# Patient Record
Sex: Female | Born: 1935 | Race: White | Hispanic: No | Smoking: Never smoker
Health system: Southern US, Community
[De-identification: ages and names within clinical notes are randomized; demographics above are authoritative.]

## PROBLEM LIST (undated history)

## (undated) DIAGNOSIS — E785 Hyperlipidemia, unspecified: Secondary | ICD-10-CM

## (undated) DIAGNOSIS — I10 Essential (primary) hypertension: Secondary | ICD-10-CM

## (undated) DIAGNOSIS — F0390 Unspecified dementia without behavioral disturbance: Secondary | ICD-10-CM

## (undated) DIAGNOSIS — J309 Allergic rhinitis, unspecified: Secondary | ICD-10-CM

## (undated) HISTORY — DX: Allergic rhinitis, unspecified: J30.9

## (undated) HISTORY — DX: Unspecified dementia, unspecified severity, without behavioral disturbance, psychotic disturbance, mood disturbance, and anxiety: F03.90

## (undated) HISTORY — PX: APPENDECTOMY: SHX54

## (undated) HISTORY — PX: ABDOMINAL HYSTERECTOMY: SHX81

## (undated) HISTORY — DX: Essential (primary) hypertension: I10

## (undated) HISTORY — DX: Hyperlipidemia, unspecified: E78.5

---

## 2009-02-27 ENCOUNTER — Ambulatory Visit: Payer: Self-pay | Admitting: Internal Medicine

## 2009-02-27 ENCOUNTER — Telehealth: Payer: Self-pay | Admitting: Internal Medicine

## 2009-02-27 DIAGNOSIS — R51 Headache: Secondary | ICD-10-CM

## 2009-02-27 DIAGNOSIS — R519 Headache, unspecified: Secondary | ICD-10-CM | POA: Insufficient documentation

## 2009-02-27 LAB — CONVERTED CEMR LAB
AST: 20 units/L (ref 0–37)
Albumin: 3.8 g/dL (ref 3.5–5.2)
Alkaline Phosphatase: 54 units/L (ref 39–117)
BUN: 15 mg/dL (ref 6–23)
Basophils Absolute: 0 10*3/uL (ref 0.0–0.1)
Basophils Relative: 0.3 % (ref 0.0–3.0)
Creatinine, Ser: 0.7 mg/dL (ref 0.4–1.2)
Eosinophils Absolute: 0.1 10*3/uL (ref 0.0–0.7)
Folate: >20 ng/mL
GFR calc non Af Amer: 85.76 mL/min (ref >60)
Lymphs Abs: 1.6 10*3/uL (ref 0.7–4.0)
Monocytes Relative: 7.5 % (ref 3.0–12.0)
Neutro Abs: 5.3 10*3/uL (ref 1.4–7.7)
Neutrophils Relative %: 70.5 % (ref 43.0–77.0)
Platelets: 236 10*3/uL (ref 150.0–400.0)
TSH: 1.77 microintl units/mL (ref 0.35–5.50)
Total Protein: 6.9 g/dL (ref 6.0–8.3)
WBC: 7.6 10*3/uL (ref 4.5–10.5)

## 2009-03-04 ENCOUNTER — Ambulatory Visit: Payer: Self-pay | Admitting: Cardiovascular Disease

## 2009-03-05 ENCOUNTER — Telehealth: Payer: Self-pay | Admitting: Internal Medicine

## 2009-03-05 ENCOUNTER — Encounter (INDEPENDENT_AMBULATORY_CARE_PROVIDER_SITE_OTHER): Payer: Self-pay | Admitting: *Deleted

## 2009-03-28 ENCOUNTER — Ambulatory Visit: Payer: Self-pay | Admitting: Internal Medicine

## 2009-03-28 DIAGNOSIS — F0390 Unspecified dementia without behavioral disturbance: Secondary | ICD-10-CM | POA: Insufficient documentation

## 2009-03-28 DIAGNOSIS — I10 Essential (primary) hypertension: Secondary | ICD-10-CM | POA: Insufficient documentation

## 2009-03-28 DIAGNOSIS — H9319 Tinnitus, unspecified ear: Secondary | ICD-10-CM | POA: Insufficient documentation

## 2009-07-02 ENCOUNTER — Ambulatory Visit: Payer: Self-pay | Admitting: Internal Medicine

## 2009-12-31 ENCOUNTER — Ambulatory Visit: Payer: Self-pay | Admitting: Internal Medicine

## 2009-12-31 DIAGNOSIS — R05 Cough: Secondary | ICD-10-CM | POA: Insufficient documentation

## 2010-02-25 ENCOUNTER — Ambulatory Visit: Payer: Self-pay | Admitting: Internal Medicine

## 2010-02-25 DIAGNOSIS — J309 Allergic rhinitis, unspecified: Secondary | ICD-10-CM | POA: Insufficient documentation

## 2010-04-22 NOTE — Assessment & Plan Note (Signed)
Summary: 4-6 WK FU  STC   Vital Signs:  Patient profile:   77 year old female Height:      64 inches (162.56 cm) Weight:      137.4 pounds (62.45 kg) O2 Sat:      98 % on Room air Temp:     97.5 degrees F (36.39 degrees C) oral Pulse rate:   65 / minute BP sitting:   146 / 78  (left arm) Cuff size:   regular  Vitals Entered By: Orlan Leavens (March 28, 2009 11:09 AM)  O2 Flow:  Room air CC: 4 week follow-up Is Patient Diabetic? No Pain Assessment Patient in pain? no        Primary Care Provider:  Newt Lukes MD  CC:  4 week follow-up.  History of Present Illness: here for followup memory loss no sig change in symptoms per pt dtr no longer taking any herbal supp or vitamins - ?if any could help  also c/o 2 week hx "vibrating" in left ear - denies hearing loss no hearing pain or HA symptoms occur daily, 2-3x/day and last <79min per time - no "pulsing" sensation to the vibration  also ?high blood pressure - dtr reports finding "old medication list" which had "blood pressure" written next to one med -   Current Medications (verified): 1)  None  Allergies (verified): No Known Drug Allergies  Past History:  Past Medical History: dementia hypertension  Review of Systems  The patient denies anorexia, chest pain, headaches, incontinence, and difficulty walking.    Physical Exam  General:  alert, well-developed, well-nourished, and cooperative to examination.   dtr at side Ears:  normal pinnae bilaterally, without erythema, swelling, or tenderness to palpation. TMs clear, without effusion, or cerumen impaction. Hearing grossly normal bilaterally  Mouth:  teeth and gums in good repair; mucous membranes moist, without lesions or ulcers. oropharynx clear without exudate, erythema.  Lungs:  normal respiratory effort, no intercostal retractions or use of accessory muscles; normal breath sounds bilaterally - no crackles and no wheezes.    Heart:  normal rate,  regular rhythm, no murmur, and no rub. BLE without edema. Neurologic:  alert & oriented X3 and cranial nerves II-XII symetrically intact.  strength normal in all extremities, sensation intact to light touch, and gait normal. speech fluent without dysarthria or aphasia; follows commands with good comprehension. 2/3 recall at 3 minutes Psych:  Oriented X3, memory intact for recent and remote, normally interactive, good eye contact, not anxious appearing, not depressed appearing, and not agitated.      Impression & Recommendations:  Problem # 1:  DEMENTIA, MILD (ICD-294.8)  labs, head ct reviewed - normal start aricept - titrate dose after 4-6 weeks, then consider adding namedna Time spent with patient/dtr 25 minutes, more than 50% of this time was spent counseling patient/dtr on memory loss and problems concerning the need for slow medication titrations, possible side effects   Orders: Prescription Created Electronically [phone])  Problem # 2:  TINNITUS, LEFT (ICD-388.30) exam benign - refer to audiology for further eval/tx Orders: Audiology (Audio)  Problem # 3:  HYPERTENSION (ICD-401.9) dtr notes poss hx same but pt denies (?prev on med for blood pressure per old list dtr found - uncertain what med) - should not be exac by aricept but consider antiHTN med prior to initiating namenda in future BP today: 146/78 Prior BP: 140/74 (02/27/2009)  Labs Reviewed: K+: 4.5 (02/27/2009) Creat: : 0.7 (02/27/2009)     Complete  Medication List: 1)  Aricept 5 Mg Tabs (Donepezil hcl) .Marland Kitchen.. 1 by mouth by mouth at bedtime 2)  Calcium 500 Mg Tabs (Calcium carbonate) .Marland Kitchen.. 1 by mouth two times a day 3)  Vitamin D 1000 Unit Tabs (Cholecalciferol) .Marland Kitchen.. 1 by mouth once daily 4)  Multivitamins Tabs (Multiple vitamin) .Marland Kitchen.. 1 by mouth once daily  Patient Instructions: 1)  it was good to see you today.  2)  start Aricept - your prescriptions have been electronically submitted to your pharmacy. Please take as  directed. Contact our office if you believe you're having problems with the medication(s). If tolerating well after 4 weeks, call and we will increase to next higher dose  3)  watch blood pressure - call if >140/90 4)  Please schedule a follow-up appointment in 3 months, sooner if problems.  5)  Take calcium +Vitamin D daily: goal calcium 1200mg /d and Vit D 1000-1200 unit/d Prescriptions: ARICEPT 5 MG TABS (DONEPEZIL HCL) 1 by mouth by mouth at bedtime  #30 x 3   Entered and Authorized by:   Newt Lukes MD   Signed by:   Newt Lukes MD on 03/28/2009   Method used:   Electronically to        [facility] (retail)       [address]       [place], [place]  [address]       Ph: [phone]       Fax: [phone]   RxID:   [id number]

## 2010-04-22 NOTE — Assessment & Plan Note (Signed)
Summary: 3 MO ROV /NWS   Vital Signs:  Patient profile:   77 year old female Height:      64 inches (162.56 cm) Weight:      145.0 pounds (65.91 kg) O2 Sat:      95 % on Room air Temp:     97.6 degrees F (36.44 degrees C) oral Pulse rate:   71 / minute BP sitting:   120 / 62  (left arm) Cuff size:   regular  Vitals Entered By: Orlan Leavens (July 02, 2009 10:17 AM)  O2 Flow:  Room air CC: 3 month follow-up Is Patient Diabetic? No Pain Assessment Patient in pain? no        Primary Care Provider:  Newt Lukes MD  CC:  3 month follow-up.  History of Present Illness: here for 3 month followup   memory loss no sig change in symptoms per pt dtr - no notable decline or worsening- no longer taking any herbal supp or vitamins -  no adv SE of aricept  also ?high blood pressure - dtr reports finding "old medication list" which had "blood pressure" written next to one med -  home BP 130/60s x 2 in past 3 weeks - no HA, vision change or edema  Current Medications (verified): 1)  Aricept 5 Mg Tabs (Donepezil Hcl) .Marland Kitchen.. 1 By Mouth By Mouth At Bedtime 2)  Calcium 500 Mg Tabs (Calcium Carbonate) .Marland Kitchen.. 1 By Mouth Two Times A Day 3)  Vitamin D 1000 Unit Tabs (Cholecalciferol) .Marland Kitchen.. 1 By Mouth Once Daily 4)  Multivitamins  Tabs (Multiple Vitamin) .Marland Kitchen.. 1 By Mouth Once Daily  Allergies (verified): No Known Drug Allergies  Past History:  Past Medical History: dementia hypertension, borderline  Review of Systems  The patient denies anorexia, fever, weight loss, hoarseness, chest pain, syncope, dyspnea on exertion, and headaches.    Physical Exam  General:  alert, well-developed, well-nourished, and cooperative to examination.   dtr at side Lungs:  normal respiratory effort, no intercostal retractions or use of accessory muscles; normal breath sounds bilaterally - no crackles and no wheezes.    Heart:  normal rate, regular rhythm, no murmur, and no rub. BLE without  edema. Neurologic:  alert & oriented X3 and cranial nerves II-XII symetrically intact.  strength normal in all extremities, sensation intact to light touch, and gait normal. speech fluent without dysarthria or aphasia; follows commands with good comprehension. 2/3 recall at 3 minutes   Impression & Recommendations:  Problem # 1:  DEMENTIA, MILD (ICD-294.8) Assessment Unchanged  labs, head ct reviewed - normal cont aricept - no dose changes needed -in future consider adding nameda as needed   Problem # 2:  HYPERTENSION (ICD-401.9) Assessment: Improved  cont with diet, salt restirction and exercise control w/o meds for now  BP today: 120/62 Prior BP: 146/78 (03/28/2009)  Labs Reviewed: K+: 4.5 (02/27/2009) Creat: : 0.7 (02/27/2009)     Complete Medication List: 1)  Aricept 5 Mg Tabs (Donepezil hcl) .Marland Kitchen.. 1 by mouth by mouth at bedtime 2)  Calcium 500 Mg Tabs (Calcium carbonate) .Marland Kitchen.. 1 by mouth two times a day 3)  Vitamin D 1000 Unit Tabs (Cholecalciferol) .Marland Kitchen.. 1 by mouth once daily 4)  Multivitamins Tabs (Multiple vitamin) .Marland Kitchen.. 1 by mouth once daily  Patient Instructions: 1)  it was good to see you today. 2)  refill aricept without other medication changes today -  3)  Please schedule a follow-up appointment in 4-6 months to monitor, call  sooner if problems.  4)  consider vaccination options available including tetnus/diptheria (whooping cough), shingles and pneumonia  - let us know if you have interest in obtaining any of these recommended shots - Prescriptions: ARICEPT 5 MG TABS (DONEPEZIL HCL) 1 by mouth by mouth at bedtime  #30 x 5   Entered and Authorized by:   Newt Lukes MD   Signed by:   Newt Lukes MD on 07/02/2009   Method used:   Electronically to        [facility] (retail)       [address]       [place], [place]  [address]       Ph: [phone]       Fax: [phone]   RxID:   [id number] ARICEPT 5 MG TABS  (DONEPEZIL HCL) 1 by mouth by mouth at bedtime  #30 x 5   Entered by:   Orlan Leavens   Authorized by:   Newt Lukes MD   Signed by:   Orlan Leavens on 07/02/2009   Method used:   Electronically to        [facility] (retail)       [address]       [place], [place]  [address]       Ph: [phone]       Fax: [phone]   RxID:   [id number]

## 2010-04-22 NOTE — Assessment & Plan Note (Signed)
Summary: PER PT 6 MTH FU  STC   Vital Signs:  Patient profile:   77 year old female Height:      64 inches (162.56 cm) Weight:      148.12 pounds (67.33 kg) BMI:     25.52 O2 Sat:      96 % on Room air Temp:     97.3 degrees F (36.28 degrees C) oral Pulse rate:   82 / minute BP sitting:   132 / 60  (left arm) Cuff size:   regular  Vitals Entered By: Orlan Leavens RMA (December 31, 2009 10:13 AM)  O2 Flow:  Room air CC: 6 month follow-up Is Patient Diabetic? No Pain Assessment Patient in pain? no      Comments Pt states out of aricept does md want her to continue   Primary Care Provider:  Newt Lukes MD  CC:  6 month follow-up.  History of Present Illness: here for 6 month followup   dementia/ memory loss no sig change in symptoms per pt dtr while taking med (off x 30d when ran out of med) no notable decline or worsening- no longer taking any herbal supp or vitamins -  no adv SE on aricept  also ?high blood pressure - prev, dtr reported finding "old medication list" which had "blood pressure" written next to one med -  home BP 130/60s x 2 in past 3 weeks - no HA, vision change or edema  Clinical Review Panels:  CBC   WBC:  7.6 (02/27/2009)   RBC:  4.47 (02/27/2009)   Hgb:  13.7 (02/27/2009)   Hct:  40.7 (02/27/2009)   Platelets:  236.0 (02/27/2009)   MCV  91.1 (02/27/2009)   MCHC  33.6 (02/27/2009)   RDW  13.3 (02/27/2009)   PMN:  70.5 (02/27/2009)   Lymphs:  20.8 (02/27/2009)   Monos:  7.5 (02/27/2009)   Eosinophils:  0.9 (02/27/2009)   Basophil:  0.3 (02/27/2009)  Complete Metabolic Panel   Glucose:  103 (02/27/2009)   Sodium:  144 (02/27/2009)   Potassium:  4.5 (02/27/2009)   Chloride:  105 (02/27/2009)   CO2:  32 (02/27/2009)   BUN:  15 (02/27/2009)   Creatinine:  0.7 (02/27/2009)   Albumin:  3.8 (02/27/2009)   Total Protein:  6.9 (02/27/2009)   Calcium:  9.5 (02/27/2009)   Total Bili:  0.6 (02/27/2009)   Alk Phos:  54 (02/27/2009)  SGPT (ALT):  17 (02/27/2009)   SGOT (AST):  20 (02/27/2009)   Current Medications (verified): 1)  Aricept 5 Mg Tabs (Donepezil Hcl) .Marland Kitchen.. 1 By Mouth By Mouth At Bedtime 2)  Calcium 500 Mg Tabs (Calcium Carbonate) .Marland Kitchen.. 1 By Mouth Two Times A Day 3)  Vitamin D 1000 Unit Tabs (Cholecalciferol) .Marland Kitchen.. 1 By Mouth Once Daily 4)  Multivitamins  Tabs (Multiple Vitamin) .Marland Kitchen.. 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         c/o tickle in throat and cough x 2 days, non  productive  Physical Exam  General:  alert, well-developed, well-nourished, and cooperative to examination.   dtr at side Eyes:  vision grossly intact; pupils equal, round and reactive to light.  conjunctiva and lids normal.    Mouth:  teeth and gums in good repair; mucous membranes moist, without lesions or ulcers. oropharynx clear without exudate, no erythema, no PND Lungs:  normal respiratory effort, no intercostal retractions or use of accessory muscles; normal breath sounds bilaterally - no crackles and no wheezes.    Heart:  normal rate, regular rhythm, no murmur, and no rub. BLE without edema. Skin:  1mm rough scab, no ulceration or open wound over bridge of nose, not inflammed - poss AK  - also SK over right scapula region, noninflammed Psych:  Oriented X3, memory intact for recent and remote, normally interactive, good eye contact, not anxious appearing, not depressed appearing, and not  agitated.      Impression & Recommendations:  Problem # 1:  COUGH (ICD-786.2) lungs clear and O2 normal - suspect allg rhinitis with season/weather change -  rec OTC claritin + robitussin - call if symptoms worse - pt/dtr agree  Problem # 2:  HYPERTENSION (ICD-401.9)  cont with diet, salt restirction and exercise control w/o meds for now to monitor at facility every other week and call if >140/90 (avoid overtx in elderly)   Prior BP: 146/78 (03/28/2009) Prior BP: 120/62 (07/02/2009) BP today: 132/60  Labs Reviewed: K+: 4.5 (02/27/2009) Creat: : 0.7 (02/27/2009)     Problem # 3:  DEMENTIA, MILD (ICD-294.8)  labs, head ct reviewed 02/2009- normal cont aricept - no dose changes needed -in future consider adding nameda as needed   Time spent with patient and dtr 25 minutes, more than 50% of this time was spent counseling patient on importance of med compliance to prevent acceleration of memory loss, possible med titration and otpions for OTC med tx of cough and allergy symptoms that are safe with her rx meds  Complete Medication List: 1)  Aricept 5 Mg Tabs (Donepezil hcl) .Marland Kitchen.. 1 by mouth by mouth at bedtime 2)  Calcium 500 Mg Tabs (Calcium carbonate) .Marland Kitchen.. 1 by mouth two times a day 3)  Vitamin D 1000 Unit Tabs (Cholecalciferol) .Marland Kitchen.. 1 by mouth once daily 4)  Multivitamins Tabs (Multiple vitamin) .Marland Kitchen.. 1 by mouth once daily  Patient Instructions: 1)  it was good to see you today. 2)  refill aricept, no other medication changes today -  3)  Please schedule a follow-up appointment in 6 months to monitor memory and blood pressure, call sooner if problems.  4)  monitor blood pressure at your facility every other week - call if over 140/90 to consider med tx as needed  5)  consider referral to dermatology as needed - monitor facial skin for any changes as discussed 6)  for cough, use claritin and/or plain robitussin OTC as needed - call if cough is productive, deeper, hard to beathe  or fever symptoms  7)  happy birthday! Prescriptions: ARICEPT 5 MG TABS (DONEPEZIL HCL) 1 by mouth by mouth at bedtime  #30 x 12   Entered and Authorized by:   Newt Lukes MD   Signed by:   Newt Lukes MD on 12/31/2009   Method used:   Electronically to        [facility] (retail)       [address]       [place]  [place], [place]  [address]       Ph: [phone]       Fax: [phone]   RxID:   [id number]

## 2010-04-22 NOTE — Assessment & Plan Note (Signed)
Summary: sniffles,lingering cold/cd   Vital Signs:  Patient profile:   77 year old female Height:      64 inches (162.56 cm) Weight:      152.4 pounds (69.27 kg) O2 Sat:      99 % on Room air Temp:     98.9 degrees F (37.17 degrees C) oral Pulse rate:   78 / minute BP sitting:   120 / 72  (left arm) Cuff size:   regular  Vitals Entered By: Orlan Leavens RMA (February 25, 2010 1:26 PM)  O2 Flow:  Room air CC: Cold sxs, URI symptoms Is Patient Diabetic? No Pain Assessment Patient in pain? no        Primary Care Provider:  Newt Lukes MD  CC:  Cold sxs and URI symptoms.  History of Present Illness:  URI Symptoms      This is an 77 year old woman who presents with URI symptoms.  The symptoms began 4-8 weeks ago.  The severity is described as mild.  persisting throat irritaion and cough.  The patient reports nasal congestion, clear nasal discharge, and dry cough, but denies purulent nasal discharge, sore throat, productive cough, earache, and sick contacts.  The patient denies fever, stiff neck, dyspnea, wheezing, rash, vomiting, diarrhea, and use of an antipyretic.  The patient also reports itchy throat, sneezing, and seasonal symptoms.  The patient denies itchy watery eyes, headache, muscle aches, and severe fatigue.  The patient denies the following risk factors for Strep sinusitis: tooth pain, Strep exposure, and tender adenopathy.    Current Medications (verified): 1)  Aricept 5 Mg Tabs (Donepezil Hcl) .Marland Kitchen.. 1 By Mouth By Mouth At Bedtime 2)  Calcium 500 Mg Tabs (Calcium Carbonate) .Marland Kitchen.. 1 By Mouth Two Times A Day 3)  Vitamin D 1000 Unit Tabs (Cholecalciferol) .Marland Kitchen.. 1 By Mouth Once Daily 4)  Multivitamins  Tabs (Multiple Vitamin) .Marland Kitchen.. 1 By Mouth Once Daily  Allergies (verified): No Known Drug Allergies  Past History:  Past Medical History: dementia hypertension, borderline   Review of Systems  The patient denies weight loss, vision loss, decreased hearing,  syncope, and hemoptysis.    Physical Exam  General:  alert, well-developed, well-nourished, and cooperative to examination. nontoxic;   dtr at side Eyes:  vision grossly intact; pupils equal, round and reactive to light.  conjunctiva and lids normal.    Ears:  hazy TMS, no erythema - no effusion Mouth:  teeth and gums in good repair; mucous membranes moist, without lesions or ulcers. oropharynx clear without exudate, mild erythema. PND Lungs:  normal respiratory effort, no intercostal retractions or use of accessory muscles; normal breath sounds bilaterally - no crackles and no wheezes.    Heart:  normal rate, regular rhythm, no murmur, and no rub. BLE without edema.    Impression & Recommendations:  Problem # 1:  ALLERGIC RHINITIS (ICD-477.9)  add nasal steroid to daily claritin - also tessalon for cough irritation - no signs or symptoms infx on hx or exam  Her updated medication list for this problem includes:    Claritin 10 Mg Tabs (Loratadine) .Marland Kitchen... 1 by mouth once daily everyday    Fluticasone Propionate 50 Mcg/act Susp (Fluticasone propionate) .Marland Kitchen... 2 sprays each nostril  every morning  Orders: Prescription Created Electronically [phone])  Problem # 2:  HYPERTENSION (ICD-401.9)  cont with diet, salt restirction and exercise control w/o meds for now to monitor at facility every other week and call if >140/90 (avoid overtx in  elderly) home BP reviewed - SBP140-150 butnormal today - cont to monitor   Prior BP: 146/78 (03/28/2009) Prior BP: 120/62 (07/02/2009) Prior BP: 132/60 (12/31/2009) BP today: 120/72   Labs Reviewed: K+: 4.5 (02/27/2009) Creat: : 0.7 (02/27/2009)     Complete Medication List: 1)  Aricept 5 Mg Tabs (Donepezil hcl) .Marland Kitchen.. 1 by mouth by mouth at bedtime 2)  Calcium 500 Mg Tabs (Calcium carbonate) .Marland Kitchen.. 1 by mouth two times a day 3)  Vitamin D 1000 Unit Tabs (Cholecalciferol) .Marland Kitchen.. 1 by mouth once daily 4)  Multivitamins Tabs (Multiple vitamin) .Marland Kitchen.. 1 by  mouth once daily 5)  Claritin 10 Mg Tabs (Loratadine) .Marland Kitchen.. 1 by mouth once daily everyday 6)  Fluticasone Propionate 50 Mcg/act Susp (Fluticasone propionate) .... 2 sprays each nostril  every morning 7)  Tessalon Perles 100 Mg Caps (Benzonatate) .Marland Kitchen.. 1 by mouth three times a day x 7days then as needed for cough  Patient Instructions: 1)  it was good to see you today. 2)  start steroid nasal spray and tessalon for allergies and cough - your prescriptions have been electronically submitted to your pharmacy. Please take as directed. Contact our office if you believe you're having problems with the medication(s).  3)  continue monitor blood pressure at your facility every other week - call if over 140/90 to consider med tx as needed - 4)   call if cough is productive, deeper, hard to beathe or any fever symptoms  5)  Please schedule a follow-up appointment in 3months to monitor BP, call sooner if problems.  Prescriptions: TESSALON PERLES 100 MG CAPS (BENZONATATE) 1 by mouth three times a day x 7days then as needed for cough  #40 x 0   Entered and Authorized by:   Newt Lukes MD   Signed by:   Newt Lukes MD on 02/25/2010   Method used:   Electronically to        [facility] (retail)       [address]       [place], [place]  [address]       Ph: [phone]       Fax: [phone]   RxID:   [id number] FLUTICASONE PROPIONATE 50 MCG/ACT SUSP (FLUTICASONE PROPIONATE) 2 sprays each nostril  every morning  #1 x 1   Entered and Authorized by:   Newt Lukes MD   Signed by:   Newt Lukes MD on 02/25/2010   Method used:   Electronically to        [facility] (retail)       [address]       [place], [place]  [address]       Ph: [phone]       Fax: [phone]   RxID:   [id number]    Orders Added: 1)  Est. Patient Level IV [88416] 2)  Prescription Created Electronically [phone]

## 2010-04-22 NOTE — Miscellaneous (Signed)
Summary: Optometrist Release   Imported By: Lester Astoria 03/29/2009 10:56:57  _____________________________________________________________________  External Attachment:    Type:   Image     Comment:   External Document

## 2010-05-29 ENCOUNTER — Ambulatory Visit: Payer: Self-pay | Admitting: Internal Medicine

## 2010-06-02 ENCOUNTER — Encounter: Payer: Self-pay | Admitting: Internal Medicine

## 2010-06-02 ENCOUNTER — Ambulatory Visit (INDEPENDENT_AMBULATORY_CARE_PROVIDER_SITE_OTHER): Payer: Medicare Other | Admitting: Internal Medicine

## 2010-06-02 DIAGNOSIS — J309 Allergic rhinitis, unspecified: Secondary | ICD-10-CM

## 2010-06-02 DIAGNOSIS — I10 Essential (primary) hypertension: Secondary | ICD-10-CM

## 2010-06-02 DIAGNOSIS — F068 Other specified mental disorders due to known physiological condition: Secondary | ICD-10-CM

## 2010-06-10 NOTE — Assessment & Plan Note (Signed)
Summary: 3 MO ROV/NWS #   Vital Signs:  Patient profile:   77 year old Hansen Weight:      151.12 pounds (68.69 kg) O2 Sat:      97 % on Room air Temp:     98.8 degrees F (37.11 degrees C) oral Pulse rate:   74 / minute BP sitting:   130 / 68  (left arm) Cuff size:   regular  O2 Flow:  Room air CC: 3 month follow-up Is Patient Diabetic? No Pain Assessment Patient in pain? no        Primary Care Provider:  Newt Lukes MD  CC:  3 month follow-up.  History of Present Illness:  here for followup   dementia/ memory loss - reports compliance with ongoing medical treatment and no changes in medication dose or frequency. denies adverse side effects related to current therapy.  no sig change in symptoms per pt dtr while taking med-  no notable decline or worsening- no longer taking any herbal supp or vitamins -   high blood pressure - prev, dtr reported finding "old medication list" which had "blood pressure" written next to one med -  home BP log reviewed: 140-150s/60-70ss  no HA, vision change or edema  allg rhinitis - cont runny nose symptoms - no sinus pressure, discolored nasal dc or fever - no ST - contast sneezing each AM  Clinical Review Panels:  CBC   WBC:  7.6 (02/27/2009)   RBC:  4.47 (02/27/2009)   Hgb:  13.7 (02/27/2009)   Hct:  40.7 (02/27/2009)   Platelets:  236.0 (02/27/2009)   MCV  91.1 (02/27/2009)   MCHC  33.6 (02/27/2009)   RDW  13.3 (02/27/2009)   PMN:  70.5 (02/27/2009)   Lymphs:  20.8 (02/27/2009)   Monos:  7.5 (02/27/2009)   Eosinophils:  0.9 (02/27/2009)   Basophil:  0.3 (02/27/2009)  Complete Metabolic Panel   Glucose:  103 (02/27/2009)   Sodium:  144 (02/27/2009)   Potassium:  4.5 (02/27/2009)   Chloride:  105 (02/27/2009)   CO2:  32 (02/27/2009)   BUN:  15 (02/27/2009)   Creatinine:  0.7 (02/27/2009)   Albumin:  3.8 (02/27/2009)   Total Protein:  6.9 (02/27/2009)   Calcium:  9.5 (02/27/2009)   Total Bili:  0.6  (02/27/2009)   Alk Phos:  54 (02/27/2009)   SGPT (ALT):  17 (02/27/2009)   SGOT (AST):  20 (02/27/2009)   Current Medications (verified): 1)  Aricept 5 Mg Tabs (Donepezil Hcl) .Marland Kitchen.. 1 By Mouth By Mouth At Bedtime 2)  Calcium 500 Mg Tabs (Calcium Carbonate) .Marland Kitchen.. 1 By Mouth Two Times A Day 3)  Vitamin D 1000 Unit Tabs (Cholecalciferol) .Marland Kitchen.. 1 By Mouth Once Daily 4)  Multivitamins  Tabs (Multiple Vitamin) .Marland Kitchen.. 1 By Mouth Once Daily 5)  Claritin 10 Mg Tabs (Loratadine) .Marland Kitchen.. 1 By Mouth Once Daily Everyday 6)  Fluticasone Propionate 50 Mcg/act Susp (Fluticasone Propionate) .... 2 Sprays Each Nostril  Every Morning  Allergies (verified): No Known Drug Allergies  Past History:  Past Medical History: dementia hypertension, borderline    Review of Systems  The patient denies fever, weight gain, chest pain, dyspnea on exertion, and headaches.    Physical Exam  General:  alert, well-developed, well-nourished, and cooperative to examination.   dtr at side Lungs:  normal respiratory effort, no intercostal retractions or use of accessory muscles; normal breath sounds bilaterally - no crackles and no wheezes.    Heart:  normal rate,  regular rhythm, no murmur, and no rub. BLE without edema.  Psych:  Oriented X3 but repetitive questions, memory intact for recent < remote, normally interactive, good eye contact, not anxious appearing, not depressed appearing, and not agitated.      Impression & Recommendations:  Problem # 1:  DEMENTIA, MILD (ICD-294.8)  labs, head ct reviewed 02/2009- normal cont aricept but titrate up dose now - new erx done in future consider adding nameda as needed   Time spent with patient and dtr 25 minutes, more than 50% of this time was spent counseling patient on importance of med compliance to prevent acceleration of memory loss, possible med titration and otpions for OTC med tx of cough and allergy symptoms that are safe with her rx meds  Orders: Prescription  Created Electronically [phone])  Problem # 2:  HYPERTENSION (ICD-401.9)  Her updated medication list for this problem includes:    Amlodipine Besylate 2.5 Mg Tabs (Amlodipine besylate) .Marland Kitchen... 1 by mouth once daily for blood pressure control  cont with diet, salt restirction and exercise control  since home BP log high, add low dose med now - erx amlodipine dose to monitor at facility every other week and call if >135/85 (avoid overtx in elderly) home BP reviewed - SBP140-150 but high normal today -  Labs Reviewed: K+: 4.5 (02/27/2009) Creat: : 0.7 (02/27/2009)     BP today: 130/68 Prior BP: 120/72 (02/25/2010) Prior BP: 146/Katie (03/28/2009) Prior BP: 120/62 (07/02/2009) Prior BP: 132/60 (12/31/2009)  Labs Reviewed: K+: 4.5 (02/27/2009) Creat: : 0.7 (02/27/2009)     Orders: Prescription Created Electronically [phone])  Problem # 3:  ALLERGIC RHINITIS (ICD-477.9)  Her updated medication list for this problem includes:    Claritin 10 Mg Tabs (Loratadine) .Marland Kitchen... 1 by mouth once daily everyday    Fluticasone Propionate 50 Mcg/act Susp (Fluticasone propionate) .Marland Kitchen... 2 sprays each nostril  every morning  Discussed use of allergy medications and environmental measures.   continue nasal steroid to daily claritin (or zyrtec) - no signs or symptoms infx on hx or exam  Complete Medication List: 1)  Donepezil Hcl 10 Mg Tabs (Donepezil hcl) .Marland Kitchen.. 1 by mouth at bedtime 2)  Calcium 500 Mg Tabs (Calcium carbonate) .Marland Kitchen.. 1 by mouth two times a day 3)  Vitamin D 1000 Unit Tabs (Cholecalciferol) .Marland Kitchen.. 1 by mouth once daily 4)  Multivitamins Tabs (Multiple vitamin) .Marland Kitchen.. 1 by mouth once daily 5)  Claritin 10 Mg Tabs (Loratadine) .Marland Kitchen.. 1 by mouth once daily everyday 6)  Fluticasone Propionate 50 Mcg/act Susp (Fluticasone propionate) .... 2 sprays each nostril  every morning 7)  Amlodipine Besylate 2.5 Mg Tabs (Amlodipine besylate) .Marland Kitchen.. 1 by mouth once daily for blood pressure control  Patient  Instructions: 1)  it was good to see you today. 2)  try generic zyrtec in place of current [facility] allergy medication to help runny nose symptoms - take as needed  3)  increase dose of generic Aricept 4)  add low dose amlodipine for blood pressure -  5)  your prescriptions have been electronically submitted to your pharmacy. Please take as directed. Contact our office if you believe you're having problems with the medication(s).  6)  Limit your Sodium (Salt). 7)  Check your Blood Pressure regularly. If it is above 135/85 you should make an appointment. (normal BP 120/80) 8)  Please schedule a follow-up appointment in 3 months to review blood pressure, memory and allergy symptoms, call sooner if problems.  Prescriptions: AMLODIPINE BESYLATE 2.5 MG  TABS (AMLODIPINE BESYLATE) 1 by mouth once daily for blood pressure control  #30 x 6   Entered and Authorized by:   Newt Lukes MD   Signed by:   Newt Lukes MD on 06/02/2010   Method used:   Electronically to        [facility] [address] [id number]* (retail)       [address] [place], [place]  [address]       Ph: [phone]       Fax: [phone]   RxID:   [id number] DONEPEZIL HCL 10 MG TABS (DONEPEZIL HCL) 1 by mouth at bedtime  #30 x 6   Entered and Authorized by:   Newt Lukes MD   Signed by:   Newt Lukes MD on 06/02/2010   Method used:   Electronically to        [facility] [address] #[id number]* (retail)       [address] [place], [place]  [address]       Ph: [phone]       Fax: [phone]   RxID:   [id number]    Orders Added: 1)  Est. Patient Level IV [[address]010] 2)  Prescription Created Electronically [phone]

## 2010-09-01 ENCOUNTER — Encounter: Payer: Self-pay | Admitting: Internal Medicine

## 2010-09-01 ENCOUNTER — Ambulatory Visit (INDEPENDENT_AMBULATORY_CARE_PROVIDER_SITE_OTHER): Payer: Medicare Other | Admitting: Internal Medicine

## 2010-09-01 DIAGNOSIS — J309 Allergic rhinitis, unspecified: Secondary | ICD-10-CM

## 2010-09-01 DIAGNOSIS — F068 Other specified mental disorders due to known physiological condition: Secondary | ICD-10-CM

## 2010-09-01 DIAGNOSIS — I10 Essential (primary) hypertension: Secondary | ICD-10-CM

## 2010-09-01 MED ORDER — FLUTICASONE PROPIONATE 50 MCG/ACT NA SUSP: 2.0000 | NASAL | Status: DC

## 2010-09-01 MED ORDER — AMLODIPINE BESYLATE 5 MG PO TABS: 5.0000 mg | ORAL_TABLET | Freq: Every day | ORAL | Status: DC

## 2010-09-01 MED ORDER — CETIRIZINE HCL 10 MG PO TABS
10.0000 mg | ORAL_TABLET | Freq: Every day | ORAL | Status: DC
Start: 2010-09-01 — End: 2011-05-04

## 2010-09-01 NOTE — Progress Notes (Signed)
  Subjective:    Patient ID: Katie Hansen, female    DOB: March 14, 1928, 77 y.o.   MRN: [id number]  HPI  here for followup - reviewed chronic medical issues:  dementia/ memory loss - reports compliance with ongoing medical treatment and no changes in medication dose or frequency. denies adverse side effects related to current therapy. no sig change in symptoms per pt dtr while taking med- no notable decline or worsening-  no longer taking any herbal supp or vitamins -   HTN - started amlodipine for same 05/2010 home BP log reviewed: 140-150s/60-70ss  no HA, vision change or edema   allergic rhinitis - associated with dry cough. cont runny nose symptoms - no sinus pressure, discolored nasal discharge or fever - no sore throat  - contast sneezing each AM  Past Medical History  Diagnosis Date  . Dementia   . Hypertension      Review of Systems  Constitutional: Negative for fever.  Cardiovascular: Negative for chest pain.  Neurological: Negative for headaches.  Psychiatric/Behavioral: Negative for behavioral problems.       Objective:   Physical Exam BP 132/60  Pulse 72  Temp(Src) 99 F (37.2 C) (Oral)  Ht 5\' 4"  (1.626 m)  Wt 151 lb (68.493 kg)  BMI 25.92 kg/m2  SpO2 98% Physical Exam  Constitutional: She appears well-developed and well-nourished. No distress.  Dtr at side HENT: Head: Normocephalic and atraumatic. Ears; B TMs ok, no erythema or effusion; Nose: Nose normal.  Mouth/Throat: Oropharynx is clear and moist. No oropharyngeal exudate.  Neck: Normal range of motion. Neck supple. No JVD present. No thyromegaly present.  Cardiovascular: Normal rate, regular rhythm and normal heart sounds.  No murmur heard. No BLE edema. Pulmonary/Chest: Effort normal and breath sounds normal. No respiratory distress. She has no wheezes.  Psychiatric: She has a normal mood and affect. Her behavior is normal. Judgment and thought content normal.   Lab Results  Component Value Date   WBC 7.6 02/27/2009   HGB 13.7 02/27/2009   HCT 40.7 02/27/2009   PLT 236.0 02/27/2009   ALT 17 02/27/2009   AST 20 02/27/2009   NA 144 02/27/2009   K 4.5 02/27/2009   CL 105 02/27/2009   CREATININE 0.7 02/27/2009   BUN 15 02/27/2009   CO2 32 02/27/2009   TSH 1.77 02/27/2009        Assessment & Plan:  See problem list. Medications and labs reviewed today.

## 2010-09-01 NOTE — Patient Instructions (Signed)
It was good to see you today. We have reviewed your prior records including labs and tests today Increase amlodipine to 5mg  daily for blood pressure and change claritin to zyrtec - use nasal spray for allergies as discussed - Your prescription(s) have been submitted to your pharmacy. Please take as directed and contact our office if you believe you are having problem(s) with the medication(s). Other Medications reviewed, no changes at this time. Use tylenol or ibuprofen as needed for finger/hand arthritis pain - call if pain worse Please schedule followup in 3-4 months to monitor blood pressure, call sooner if problems.

## 2010-09-01 NOTE — Assessment & Plan Note (Signed)
Started low dose amlodipine for same 05/2010 - titrate up now - new erx done BP Readings from Last 3 Encounters:  09/01/10 132/60  06/02/10 130/68  02/25/10 120/72

## 2010-09-01 NOTE — Assessment & Plan Note (Signed)
Noncompliant with nasal steroid - new erx now and change to generic zyrtec (remains on claritin generic)

## 2010-09-01 NOTE — Assessment & Plan Note (Signed)
labs, head ct reviewed 02/2009- normal  aricept ongoing - last titrated 05/2010  in future consider adding nameda as needed

## 2010-09-09 ENCOUNTER — Encounter: Payer: Self-pay | Admitting: Internal Medicine

## 2010-12-29 ENCOUNTER — Encounter: Payer: Self-pay | Admitting: Internal Medicine

## 2010-12-29 ENCOUNTER — Other Ambulatory Visit: Payer: Self-pay | Admitting: Internal Medicine

## 2010-12-29 ENCOUNTER — Ambulatory Visit (INDEPENDENT_AMBULATORY_CARE_PROVIDER_SITE_OTHER): Payer: Medicare Other | Admitting: Internal Medicine

## 2010-12-29 ENCOUNTER — Other Ambulatory Visit (INDEPENDENT_AMBULATORY_CARE_PROVIDER_SITE_OTHER): Payer: Medicare Other

## 2010-12-29 DIAGNOSIS — F068 Other specified mental disorders due to known physiological condition: Secondary | ICD-10-CM

## 2010-12-29 DIAGNOSIS — R635 Abnormal weight gain: Secondary | ICD-10-CM

## 2010-12-29 DIAGNOSIS — I10 Essential (primary) hypertension: Secondary | ICD-10-CM

## 2010-12-29 LAB — LIPID PANEL
HDL: 73.8 mg/dL (ref >39.00)
Triglycerides: 220 mg/dL — ABNORMAL HIGH (ref 0.0–149.0)
VLDL: 44 mg/dL — ABNORMAL HIGH (ref 0.0–40.0)

## 2010-12-29 LAB — LDL CHOLESTEROL, DIRECT: Direct LDL: 181.1 mg/dL

## 2010-12-29 MED ORDER — MEMANTINE HCL 5 MG PO TABS: 5.0000 mg | ORAL_TABLET | Freq: Two times a day (BID) | ORAL | Status: DC

## 2010-12-29 NOTE — Assessment & Plan Note (Signed)
Started low dose amlodipine for same 05/2010 - titrated up 07/2010 The current medical regimen is effective;  continue present plan and medications.  BP Readings from Last 3 Encounters:  12/29/10 132/62  09/01/10 132/60  06/02/10 130/68

## 2010-12-29 NOTE — Assessment & Plan Note (Signed)
labs, head ct reviewed 02/2009- normal  aricept stopped indep by pt summer due to "paper in mail telling me its top 10 dangerous medication" Willing to try nameda in place - new erx done

## 2010-12-29 NOTE — Progress Notes (Signed)
  Subjective:    Patient ID: Katie Hansen, female    DOB: 1928/02/23, 77 y.o.   MRN: [id number]  HPI   here for followup - reviewed chronic medical issues:  dementia/ memory loss - reports noncompliance with Aricept - "its a dangerous medication so i stopped". denies adverse side effects related to current therapy. no sig change in symptoms per pt dtr while taking med- no notable decline or worsening-   HTN - started amlodipine for same 05/2010, dose increase 08/2010 home BP log reviewed: improved no HA, vision change or edema   allergic rhinitis - on OTC antihist and nasal steroid spray for same   no sinus pressure, discolored nasal discharge or fever - no sore throat  -  the patient reports compliance with medication(s) as prescribed. Denies adverse side effects.  OA - PIP left index finger with ache - no injury, swelling or locking   Past Medical History  Diagnosis Date  . Dementia   . Hypertension   . Allergic rhinitis, cause unspecified     Review of Systems  Constitutional: Negative for fever.  Cardiovascular: Negative for chest pain.  Neurological: Negative for headaches.  Psychiatric/Behavioral: Negative for behavioral problems.       Objective:   Physical Exam  BP 132/62  Pulse 82  Temp(Src) 98.8 F (37.1 C) (Oral)  Ht 5\' 5"  (1.651 m)  Wt 157 lb 6.4 oz (71.396 kg)  BMI 26.19 kg/m2  SpO2 97%   Wt Readings from Last 3 Encounters:  12/29/10 157 lb 6.4 oz (71.396 kg)  09/01/10 151 lb (68.493 kg)  06/02/10 151 lb 1.9 oz (68.547 kg)    Constitutional: She appears well-developed and well-nourished. No distress.  Dtr at side HENT: Head: Normocephalic and atraumatic. Ears; B TMs ok, no erythema or effusion; Nose: Nose normal.  Mouth/Throat: Oropharynx is clear and moist. No oropharyngeal exudate.  Neck: Normal range of motion. Neck supple. No JVD present. No thyromegaly present.  Cardiovascular: Normal rate, regular rhythm and normal heart sounds.  No murmur  heard. No BLE edema. Pulmonary/Chest: Effort normal and breath sounds normal. No respiratory distress. She has no wheezes.  Psychiatric: She has a normal mood and affect. Her behavior is normal. Judgment and thought content normal.   Lab Results  Component Value Date   WBC 7.6 02/27/2009   HGB 13.7 02/27/2009   HCT 40.7 02/27/2009   PLT 236.0 02/27/2009   ALT 17 02/27/2009   AST 20 02/27/2009   NA 144 02/27/2009   K 4.5 02/27/2009   CL 105 02/27/2009   CREATININE 0.7 02/27/2009   BUN 15 02/27/2009   CO2 32 02/27/2009   TSH 1.77 02/27/2009        Assessment & Plan:  See problem list. Medications and labs reviewed today.

## 2010-12-29 NOTE — Patient Instructions (Signed)
It was good to see you today. Start Namenda for memory in place of prior Aricept - Your prescription(s) have been submitted to your pharmacy. Please take as directed and contact our office if you believe you are having problem(s) with the medication(s). Other Medications reviewed, no changes at this time. Test(s) ordered today. Your results will be called to you after review (48-72hours after test completion). If any changes need to be made, you will be notified at that time. Please schedule followup in 4-6 months to monitor blood pressure, call sooner if problems.

## 2011-05-04 ENCOUNTER — Encounter: Payer: Self-pay | Admitting: Internal Medicine

## 2011-05-04 ENCOUNTER — Ambulatory Visit (INDEPENDENT_AMBULATORY_CARE_PROVIDER_SITE_OTHER): Payer: Medicare Other | Admitting: Internal Medicine

## 2011-05-04 DIAGNOSIS — Z13820 Encounter for screening for osteoporosis: Secondary | ICD-10-CM

## 2011-05-04 DIAGNOSIS — I10 Essential (primary) hypertension: Secondary | ICD-10-CM

## 2011-05-04 DIAGNOSIS — F068 Other specified mental disorders due to known physiological condition: Secondary | ICD-10-CM

## 2011-05-04 MED ORDER — MEMANTINE HCL 10 MG PO TABS: 10.0000 mg | ORAL_TABLET | Freq: Two times a day (BID) | ORAL | Status: DC

## 2011-05-04 NOTE — Assessment & Plan Note (Signed)
Started low dose amlodipine for same 05/2010 - titrated up 07/2010 The current medical regimen is effective;  continue present plan and medications.  BP Readings from Last 3 Encounters:  05/04/11 132/70  12/29/10 132/62  09/01/10 132/60

## 2011-05-04 NOTE — Progress Notes (Signed)
  Subjective:    Patient ID: Katie Hansen, female    DOB: 1927-09-28, 78 y.o.   MRN: [id number]  HPI here for followup - reviewed chronic medical issues:  dementia/ memory loss - reports noncompliance with Aricept - "its a dangerous medication so i stopped". denies adverse side effects related to current therapy. no sig change in symptoms per pt dtr while taking med- no notable decline or worsening- started Namenda 12/2010  HTN - started amlodipine for same 05/2010, dose increase 08/2010 home BP log reviewed: improved no HA, vision change or edema   allergic rhinitis - on OTC antihist and nasal steroid spray for same   no sinus pressure, discolored nasal discharge or fever - no sore throat  -  the patient reports compliance with medication(s) as prescribed. Denies adverse side effects.  OA - PIP left index finger with ache - no injury, swelling or locking   Past Medical History  Diagnosis Date  . Dementia   . Hypertension   . Allergic rhinitis, cause unspecified   . Dyslipidemia     high HDL and LDL    Review of Systems  Constitutional: Negative for fever.  Cardiovascular: Negative for chest pain.  Neurological: Negative for headaches.  Psychiatric/Behavioral: Negative for behavioral problems.       Objective:   Physical Exam  BP 132/70  Pulse 78  Temp(Src) 98.8 F (37.1 C) (Oral)  Wt 161 lb 12.8 oz (73.392 kg)  SpO2 96%   Wt Readings from Last 3 Encounters:  05/04/11 161 lb 12.8 oz (73.392 kg)  12/29/10 157 lb 6.4 oz (71.396 kg)  09/01/10 151 lb (68.493 kg)   Constitutional: She appears well-developed and well-nourished. No distress.  Dtr at side Neck: Normal range of motion. Neck supple. No JVD present. No thyromegaly present.  Cardiovascular: Normal rate, regular rhythm and normal heart sounds.  No murmur heard. trace BLE edema at ankles. Pulmonary/Chest: Effort normal and breath sounds normal. No respiratory distress. She has no wheezes.  Psychiatric: She  has a normal mood and affect. Her behavior is normal. Judgment and thought content normal.   Lab Results  Component Value Date   WBC 7.6 02/27/2009   HGB 13.7 02/27/2009   HCT 40.7 02/27/2009   PLT 236.0 02/27/2009   CHOL 283* 12/29/2010   TRIG 220.0* 12/29/2010   HDL 73.80 12/29/2010   LDLDIRECT 181.1 12/29/2010   ALT 17 02/27/2009   AST 20 02/27/2009   NA 144 02/27/2009   K 4.5 02/27/2009   CL 105 02/27/2009   CREATININE 0.7 02/27/2009   BUN 15 02/27/2009   CO2 32 02/27/2009   TSH 2.60 12/29/2010       Assessment & Plan:  See problem list. Medications and labs reviewed today.  Screening for osteo - PMP status - check DEXA

## 2011-05-04 NOTE — Assessment & Plan Note (Signed)
labs, head ct reviewed 02/2009- normal  aricept stopped indep by pt summer 2012 due to "paper in mail telling me its top 10 dangerous medication" Willing to try nameda in place - started low dose 12/2010 Will titrate up to max dose now

## 2011-05-04 NOTE — Patient Instructions (Signed)
It was good to see you today. Increase dose Namenda for memory to 10mg  2x/day - ok to take 2 - 5mg  tabs Am and 2 - 5mg  tabs in PM until out of 5 mg tabs - then start 10mg  tabs  - Your new prescription for 10mg  tabs have been submitted to your pharmacy. Please take as directed and contact our office if you believe you are having problem(s) with the medication(s). Other Medications reviewed, no changes at this time. Bone density test ordered today. Your results will be called to you after review (48-72hours after test completion). If any changes need to be made, you will be notified at that time. Please schedule followup in 6 months to monitor blood pressure, call sooner if problems.

## 2011-05-11 ENCOUNTER — Ambulatory Visit (INDEPENDENT_AMBULATORY_CARE_PROVIDER_SITE_OTHER)
Admission: RE | Admit: 2011-05-11 | Discharge: 2011-05-11 | Disposition: A | Discharge status: Home / Self Care | Payer: Medicare Other | Source: Ambulatory Visit

## 2011-05-11 DIAGNOSIS — Z13820 Encounter for screening for osteoporosis: Secondary | ICD-10-CM

## 2011-05-19 ENCOUNTER — Other Ambulatory Visit: Payer: Self-pay | Admitting: Internal Medicine

## 2011-05-19 ENCOUNTER — Encounter: Payer: Self-pay | Admitting: Internal Medicine

## 2011-05-19 DIAGNOSIS — M810 Age-related osteoporosis without current pathological fracture: Secondary | ICD-10-CM | POA: Insufficient documentation

## 2011-05-19 MED ORDER — ALENDRONATE SODIUM 70 MG PO TABS: 70.0000 mg | ORAL_TABLET | ORAL | Status: DC

## 2011-06-26 ENCOUNTER — Other Ambulatory Visit: Payer: Self-pay | Admitting: Internal Medicine

## 2011-06-29 ENCOUNTER — Other Ambulatory Visit: Payer: Self-pay | Admitting: *Deleted

## 2011-06-29 MED ORDER — AMLODIPINE BESYLATE 5 MG PO TABS: 5.0000 mg | ORAL_TABLET | Freq: Every day | ORAL | Status: DC

## 2011-11-02 ENCOUNTER — Ambulatory Visit: Payer: Medicare Other | Admitting: Internal Medicine

## 2011-11-02 DIAGNOSIS — Z0289 Encounter for other administrative examinations: Secondary | ICD-10-CM

## 2011-11-06 ENCOUNTER — Other Ambulatory Visit: Payer: Self-pay | Admitting: Internal Medicine

## 2011-11-24 ENCOUNTER — Ambulatory Visit (INDEPENDENT_AMBULATORY_CARE_PROVIDER_SITE_OTHER): Payer: Medicare Other | Admitting: Internal Medicine

## 2011-11-24 ENCOUNTER — Encounter: Payer: Self-pay | Admitting: Internal Medicine

## 2011-11-24 VITALS — BP 120/62 | HR 76 | Temp 98.4°F | Ht 65.0 in | Wt 152.8 lb

## 2011-11-24 DIAGNOSIS — I10 Essential (primary) hypertension: Secondary | ICD-10-CM

## 2011-11-24 DIAGNOSIS — Z23 Encounter for immunization: Secondary | ICD-10-CM

## 2011-11-24 DIAGNOSIS — M810 Age-related osteoporosis without current pathological fracture: Secondary | ICD-10-CM

## 2011-11-24 DIAGNOSIS — F0390 Unspecified dementia without behavioral disturbance: Secondary | ICD-10-CM

## 2011-11-24 MED ORDER — GALANTAMINE HYDROBROMIDE ER 8 MG PO CP24: 8.0000 mg | ORAL_CAPSULE | Freq: Every day | ORAL | Status: DC

## 2011-11-24 MED ORDER — ALENDRONATE SODIUM 70 MG PO TABS: 70.0000 mg | ORAL_TABLET | ORAL | Status: DC

## 2011-11-24 MED ORDER — FLUTICASONE PROPIONATE 50 MCG/ACT NA SUSP: 2.0000 | Freq: Every day | NASAL | Status: DC

## 2011-11-24 NOTE — Assessment & Plan Note (Signed)
Prescribed fosamax 04/2011 following DEXA 04/2011 -2.5  On Ca+Vit D daily and WB exercise encouraged

## 2011-11-24 NOTE — Patient Instructions (Signed)
It was good to see you today. We have reviewed your prior records including labs and tests today Start galatamine for memory in addition to Namenda Also start Fosamax every week (once each Sunday) for bones Your prescription(s) have been submitted to your pharmacy. Please take as directed and contact our office if you believe you are having problem(s) with the medication(s). Other medications reviewed, no other changes at this time.  Pneumonia shot given today - Get the flu shot this fall Please schedule followup in 6 months for blood pressure and memory recheck, call sooner if problems.

## 2011-11-24 NOTE — Progress Notes (Signed)
  Subjective:    Patient ID: Katie Hansen, female    DOB: 11-10-1927, 78 y.o.   MRN: [id number]  HPI here for followup - reviewed chronic medical issues:  dementia/ memory loss, progressive per family and ALF reports - previously rx'd Aricept, but self dc'd 02/2010 due to concern for side effects. denies adverse side effects related to current therapy. no sig change in symptoms per pt dtr while taking med- started Namenda 12/2010, max dose 04/2011.   HTN - started amlodipine for same 05/2010, dose increase 08/2010 home BP log reviewed: improved no headache, vision change or edema   allergic rhinitis - on OTC antihist and nasal steroid spray for same  no sinus pressure, discolored nasal discharge or fever - no sore throat  -  the patient reports compliance with medication(s) as prescribed. Denies adverse side effects.  OA - PIP left index finger with ache - no injury, swelling or locking  Osteoporosis, DEXA 04/2011 reviewed - on Fosamax weekly - no fracture hx or new back pain/falls   Past Medical History  Diagnosis Date  . Dementia   . Hypertension   . Allergic rhinitis, cause unspecified   . Dyslipidemia     high HDL and LDL    Review of Systems  Constitutional: Negative for fever, fatigue and unexpected weight change.  Respiratory: Negative for cough and shortness of breath.   Cardiovascular: Negative for chest pain and palpitations.  Neurological: Negative for headaches.  Psychiatric/Behavioral: Negative for hallucinations, behavioral problems and dysphoric mood. The patient is not nervous/anxious and is not hyperactive.        Objective:   Physical Exam  BP 120/62  Pulse 76  Temp 98.4 F (36.9 C) (Oral)  Ht 5\' 5"  (1.651 m)  Wt 152 lb 12.8 oz (69.31 kg)  BMI 25.43 kg/m2  SpO2 97%   Wt Readings from Last 3 Encounters:  11/24/11 152 lb 12.8 oz (69.31 kg)  05/04/11 161 lb 12.8 oz (73.392 kg)  12/29/10 157 lb 6.4 oz (71.396 kg)   Constitutional: She appears  well-developed and well-nourished. No distress.  Dtr at side Neck: Normal range of motion. Neck supple. No JVD present. No thyromegaly present.  Cardiovascular: Normal rate, regular rhythm and normal heart sounds.  No murmur heard. trace BLE edema at ankles. Pulmonary/Chest: Effort normal and breath sounds normal. No respiratory distress. She has no wheezes.  Neuro: 0/3 recall at 3 minutes - oriented to self but not time, place or purpose. Looks to dtr for assist with hx/direction Psychiatric: She has a normal mood and affect. Her behavior is normal. Judgment and thought content normal.   Lab Results  Component Value Date   WBC 7.6 02/27/2009   HGB 13.7 02/27/2009   HCT 40.7 02/27/2009   PLT 236.0 02/27/2009   CHOL 283* 12/29/2010   TRIG 220.0* 12/29/2010   HDL 73.80 12/29/2010   LDLDIRECT 181.1 12/29/2010   ALT 17 02/27/2009   AST 20 02/27/2009   NA 144 02/27/2009   K 4.5 02/27/2009   CL 105 02/27/2009   CREATININE 0.7 02/27/2009   BUN 15 02/27/2009   CO2 32 02/27/2009   TSH 2.60 12/29/2010       Assessment & Plan:  See problem list. Medications and labs reviewed today.  Colon cancer screening - despite FH (mom), pt/dtr declines need for same at this time

## 2011-11-24 NOTE — Assessment & Plan Note (Signed)
labs, head ct reviewed 02/2009- normal  aricept stopped indep by pt summer 2012 due to "paper in mail telling me its top 10 dangerous medication" Started low dose nameda 12/2010, maximized dose 04/2011 Add galatamine now and titrate as tolerated

## 2011-11-24 NOTE — Assessment & Plan Note (Signed)
Started low dose amlodipine for same 05/2010 - titrated up 07/2010 The current medical regimen is effective;  continue present plan and medications.  BP Readings from Last 3 Encounters:  11/24/11 120/62  05/04/11 132/70  12/29/10 132/62

## 2012-03-04 ENCOUNTER — Ambulatory Visit (INDEPENDENT_AMBULATORY_CARE_PROVIDER_SITE_OTHER): Payer: Medicare Other | Admitting: Internal Medicine

## 2012-03-04 ENCOUNTER — Encounter: Payer: Self-pay | Admitting: Internal Medicine

## 2012-03-04 VITALS — BP 142/72 | HR 67 | Temp 97.5°F | Ht 65.0 in | Wt 153.0 lb

## 2012-03-04 DIAGNOSIS — M545 Low back pain: Secondary | ICD-10-CM

## 2012-03-04 MED ORDER — DICLOFENAC SODIUM 75 MG PO TBEC: 75.0000 mg | DELAYED_RELEASE_TABLET | Freq: Two times a day (BID) | ORAL | Status: DC

## 2012-03-04 NOTE — Progress Notes (Signed)
Subjective:    Patient ID: Katie Hansen, female    DOB: Nov 09, 1927, 78 y.o.   MRN: [id number]  HPI  Pt presents to the clinic today with c/o lower back pain. This started 3 days ago. She is unsure if she lifted something heavy. She denies having a fall. She has never really had pain like this before. She denies any pain with urination, burning sensation, or noticeable blood in her urine. The pain is intermittent 3/10. She has not taken anything for the pain. The pain is worse when she goes from a sitting to a standing position.  Review of Systems      Past Medical History  Diagnosis Date  . Dementia   . Hypertension   . Allergic rhinitis, cause unspecified   . Dyslipidemia     high HDL and LDL    Current Outpatient Prescriptions  Medication Sig Dispense Refill  . alendronate (FOSAMAX) 70 MG tablet Take 1 tablet (70 mg total) by mouth every 7 (seven) days. Take with a full glass of water on an empty stomach.  4 tablet  11  . amLODipine (NORVASC) 5 MG tablet Take 1 tablet (5 mg total) by mouth daily.  30 tablet  5  . Calcium Carbonate (CALCIUM 500 PO) Take 1 tablet by mouth 2 (two) times daily.        . Cholecalciferol 1000 UNITS tablet Take 1,000 Units by mouth daily.        . fluticasone (FLONASE) 50 MCG/ACT nasal spray Place 2 sprays into the nose daily.  16 g  5  . memantine (NAMENDA) 10 MG tablet Take 1 tablet (10 mg total) by mouth 2 (two) times daily.  60 tablet  6  . Multiple Vitamin (MULTIVITAMIN) tablet Take 1 tablet by mouth daily.        Marland Kitchen galantamine (RAZADYNE ER) 8 MG 24 hr capsule Take 1 capsule (8 mg total) by mouth daily with breakfast.  30 capsule  6    No Known Allergies  Family History  Problem Relation Age of Onset  . Colon cancer Mother 70    History   Social History  . Marital Status: Widowed    Spouse Name: N/A    Number of Children: N/A  . Years of Education: N/A   Occupational History  . Not on file.   Social History Main Topics  .  Smoking status: Never Smoker   . Smokeless tobacco: Not on file     Comment: Widowed for many years-spouse died in plane crash. 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 moved to [place] indep living [facility]) in 08/2008 to be closer to dtr-lives alone  . Alcohol Use: No  . Drug Use: No  . Sexually Active: Not on file   Other Topics Concern  . Not on file   Social History Narrative   Widowed for many years, spouse died in plane crash. 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 Pt currently lives alone.     Constitutional: Denies fever, malaise, fatigue, headache or abrupt weight changes.  Respiratory: Denies difficulty breathing, shortness of breath, cough or sputum production.   Cardiovascular: Denies chest pain, chest tightness, palpitations or swelling in the hands or feet.  Gastrointestinal: Denies abdominal pain, bloating, constipation, diarrhea  or blood in the stool.  GU: Denies urgency, frequency, pain with urination, burning sensation, blood in urine, odor or discharge. Musculoskeletal: Pt reports lower back pain. Denies decrease in range of motion, difficulty with gait, muscle pain or joint pain and swelling.   No other specific complaints in a complete review of systems (except as listed in HPI above).  Objective:   Physical Exam  BP 142/72  Pulse 67  Temp 97.5 F (36.4 C) (Oral)  Ht 5\' 5"  (1.651 m)  Wt 153 lb (69.4 kg)  BMI 25.46 kg/m2  SpO2 97% Wt Readings from Last 3 Encounters:  03/04/12 153 lb (69.4 kg)  11/24/11 152 lb 12.8 oz (69.31 kg)  05/04/11 161 lb 12.8 oz (73.392 kg)    General: Appears her stated age, well developed, well nourished in NAD.  Cardiovascular: Normal rate and rhythm. S1,S2 noted.  No murmur, rubs or gallops noted. No  JVD or BLE edema. No carotid bruits noted. Pulmonary/Chest: Normal effort and positive vesicular breath sounds. No respiratory distress. No wheezes, rales or ronchi noted.  Abdomen: Soft and nontender. Normal bowel sounds, no bruits noted. No distention or masses noted. Liver, spleen and kidneys non palpable. No CVA tenderness. Musculoskeletal: Generalized tenderness along the lower back bilaterally. Normal range of motion. No signs of joint swelling. No difficulty with gait.  Neurological: Alert and oriented. Cranial nerves II-XII intact. Coordination normal. +DTRs bilaterally.       Assessment & Plan:   Lumbago, new onset with additional workup required:  Will give Diclofenac 75 mg BID x 14 days Place a heating pad to the affected area for 20 minutes daily  RTC as needed or if symptoms persist

## 2012-03-04 NOTE — Patient Instructions (Addendum)
Back Pain, Adult Low back pain is very common. About 1 in 5 people have back pain.The cause of low back pain is rarely dangerous. The pain often gets better over time.About half of people with a sudden onset of back pain feel better in just 2 weeks. About 8 in 10 people feel better by 6 weeks.  CAUSES Some common causes of back pain include:  Strain of the muscles or ligaments supporting the spine.  Wear and tear (degeneration) of the spinal discs.  Arthritis.  Direct injury to the back. DIAGNOSIS Most of the time, the direct cause of low back pain is not known.However, back pain can be treated effectively even when the exact cause of the pain is unknown.Answering your caregiver's questions about your overall health and symptoms is one of the most accurate ways to make sure the cause of your pain is not dangerous. If your caregiver needs more information, he or she may order lab work or imaging tests (X-rays or MRIs).However, even if imaging tests show changes in your back, this usually does not require surgery. HOME CARE INSTRUCTIONS For many people, back pain returns.Since low back pain is rarely dangerous, it is often a condition that people can learn to manageon their own.   Remain active. It is stressful on the back to sit or stand in one place. Do not sit, drive, or stand in one place for more than 30 minutes at a time. Take short walks on level surfaces as soon as pain allows.Try to increase the length of time you walk each day.  Do not stay in bed.Resting more than 1 or 2 days can delay your recovery.  Do not avoid exercise or work.Your body is made to move.It is not dangerous to be active, even though your back may hurt.Your back will likely heal faster if you return to being active before your pain is gone.  Pay attention to your body when you bend and lift. Many people have less discomfortwhen lifting if they bend their knees, keep the load close to their bodies,and  avoid twisting. Often, the most comfortable positions are those that put less stress on your recovering back.  Find a comfortable position to sleep. Use a firm mattress and lie on your side with your knees slightly bent. If you lie on your back, put a pillow under your knees.  Only take over-the-counter or prescription medicines as directed by your caregiver. Over-the-counter medicines to reduce pain and inflammation are often the most helpful.Your caregiver may prescribe muscle relaxant drugs.These medicines help dull your pain so you can more quickly return to your normal activities and healthy exercise.  Put ice on the injured area.  Put ice in a plastic bag.  Place a towel between your skin and the bag.  Leave the ice on for 15 to 20 minutes, 3 to 4 times a day for the first 2 to 3 days. After that, ice and heat may be alternated to reduce pain and spasms.  Ask your caregiver about trying back exercises and gentle massage. This may be of some benefit.  Avoid feeling anxious or stressed.Stress increases muscle tension and can worsen back pain.It is important to recognize when you are anxious or stressed and learn ways to manage it.Exercise is a great option. SEEK MEDICAL CARE IF:  You have pain that is not relieved with rest or medicine.  You have pain that does not improve in 1 week.  You have new symptoms.  You are generally   not feeling well. SEEK IMMEDIATE MEDICAL CARE IF:   You have pain that radiates from your back into your legs.  You develop new bowel or bladder control problems.  You have unusual weakness or numbness in your arms or legs.  You develop nausea or vomiting.  You develop abdominal pain.  You feel faint. Document Released: 03/09/2005 Document Revised: 09/08/2011 Document Reviewed: 07/28/2010 ExitCare Patient Information 2013 [employer].  

## 2012-03-28 ENCOUNTER — Encounter: Payer: Self-pay | Admitting: Internal Medicine

## 2012-03-28 ENCOUNTER — Ambulatory Visit (INDEPENDENT_AMBULATORY_CARE_PROVIDER_SITE_OTHER): Payer: Medicare Other | Admitting: Internal Medicine

## 2012-03-28 VITALS — BP 122/72 | HR 75 | Temp 98.6°F | Ht 65.0 in | Wt 150.1 lb

## 2012-03-28 DIAGNOSIS — J019 Acute sinusitis, unspecified: Secondary | ICD-10-CM

## 2012-03-28 DIAGNOSIS — F03B0 Unspecified dementia, moderate, without behavioral disturbance, psychotic disturbance, mood disturbance, and anxiety: Secondary | ICD-10-CM

## 2012-03-28 DIAGNOSIS — F0390 Unspecified dementia without behavioral disturbance: Secondary | ICD-10-CM

## 2012-03-28 MED ORDER — LEVOFLOXACIN 250 MG PO TABS: 250.0000 mg | ORAL_TABLET | Freq: Every day | ORAL | Status: AC

## 2012-03-28 MED ORDER — PROMETHAZINE-PHENYLEPHRINE 6.25-5 MG/5ML PO SYRP: 5.0000 mL | ORAL_SOLUTION | ORAL | Status: DC | PRN

## 2012-03-28 NOTE — Assessment & Plan Note (Signed)
labs, head ct reviewed 02/2009- normal  aricept stopped indep by pt summer 2012 due to "paper in mail telling me its top 10 dangerous medication" Started low dose nameda 12/2010, maximized dose 04/2011 Progressive symptoms so added galatamine 11/2011, will titrate as tolerated

## 2012-03-28 NOTE — Progress Notes (Signed)
  Subjective:    HPI  complains of head cold symptoms, ?sinusitus Onset >2 week ago, initially improved then relapsing and worse symptoms - [facility] at [facility], tx amox but not improved First associated with rhinorrhea, sneezing, sore throat, mild headache and low grade fever Now sinus pressure and mild-mod nasal congestion, yellow-green discharge No relief with OTC meds Precipitated by sick contacts and weather change  Past Medical History  Diagnosis Date  . Dementia   . Hypertension   . Allergic rhinitis, cause unspecified   . Dyslipidemia     high HDL and LDL    Review of Systems Constitutional: No night sweats, no unexpected weight change Pulmonary: No pleurisy or hemoptysis Cardiovascular: No chest pain or palpitations     Objective:   Physical Exam BP 122/72  Pulse 75  Temp 98.6 F (37 C) (Oral)  Ht 5\' 5"  (1.651 m)  Wt 150 lb 1.9 oz (68.094 kg)  BMI 24.98 kg/m2  SpO2 98% GEN: mildly ill appearing and audible head congestion -dtr at side HENT: NCAT, mild sinus tenderness bilaterally, nares with thick yellow discharge and turbinate swelling, oropharynx mild erythema and PND, no exudate Eyes: Vision grossly intact, no conjunctivitis Lungs: Clear to auscultation without rhonchi or wheeze, no increased work of breathing Cardiovascular: Regular rate and rhythm, no bilateral edema  Lab Results  Component Value Date   WBC 7.6 02/27/2009   HGB 13.7 02/27/2009   HCT 40.7 02/27/2009   PLT 236.0 02/27/2009   GLUCOSE 103* 02/27/2009   CHOL 283* 12/29/2010   TRIG 220.0* 12/29/2010   HDL 73.80 12/29/2010   LDLDIRECT 181.1 12/29/2010   ALT 17 02/27/2009   AST 20 02/27/2009   NA 144 02/27/2009   K 4.5 02/27/2009   CL 105 02/27/2009   CREATININE 0.7 02/27/2009   BUN 15 02/27/2009   CO2 32 02/27/2009   TSH 2.60 12/29/2010      Assessment & Plan:  Viral URI > progression to acute sinusitis Cough, postnasal drip related to above   S/p Empiric amox prescribed by UC 03/18/12 (call  to [facility] pharmacy to verify meds rx'd by [facility] - spoke with Onalee Hua re: same) Will use Levaquin 250 qd x 7 days and decongestant cough suppression - new prescriptions done Symptomatic care with Tylenol, antihistamine, hydration and rest -  Saline irrigation and salt gargle advised as needed

## 2012-03-28 NOTE — Patient Instructions (Signed)
It was good to see you today. Levaquin antibiotics and prescription decongestant/cough syrup - Your prescription(s) have been submitted to your pharmacy. Please take as directed and contact our office if you believe you are having problem(s) with the medication(s). use tylenol for aches, pain and fever symptoms as needed Hydrate, rest and call if worse or unimproved

## 2012-04-06 ENCOUNTER — Other Ambulatory Visit: Payer: Self-pay | Admitting: *Deleted

## 2012-04-06 DIAGNOSIS — F068 Other specified mental disorders due to known physiological condition: Secondary | ICD-10-CM

## 2012-04-06 MED ORDER — MEMANTINE HCL 10 MG PO TABS: 10.0000 mg | ORAL_TABLET | Freq: Two times a day (BID) | ORAL | Status: DC

## 2012-05-12 ENCOUNTER — Encounter: Payer: Self-pay | Admitting: Internal Medicine

## 2012-05-12 ENCOUNTER — Ambulatory Visit (INDEPENDENT_AMBULATORY_CARE_PROVIDER_SITE_OTHER): Payer: Self-pay | Admitting: Internal Medicine

## 2012-05-12 DIAGNOSIS — F0390 Unspecified dementia without behavioral disturbance: Secondary | ICD-10-CM

## 2012-05-12 NOTE — Patient Instructions (Signed)
It was good to see you today. We have reviewed your prior records including labs and tests today I agree it is time to contact the DMV to revoke driving privileges - let me know how i can help Read the book "the 36 hour day"

## 2012-05-12 NOTE — Progress Notes (Signed)
  Subjective:    Patient ID: Katie Hansen, female    DOB: 10-08-1927, 79 y.o.   MRN: [id number]  HPI  Pt not here - Family meeting with pt's dtr Katie Hansen) and Jennifer's husband who are concerned about patient's progressive dementia  Pt not taking meds because does remember to do so. Pt has taken her dog to vet everyday for last 4 days because worried dog is sick - told the dog is fine, but takes dog again next day because same worry and forgets she was at vet day before (dtr has been called by vet office due to vet's concern about the pt's behavior) Pt with increasing behavior and personality changes (irritabile and angry mood swings) Pt still driving independently - ?saftey, but no reported incidents known at this time  Past Medical History  Diagnosis Date  . Dementia   . Hypertension   . Allergic rhinitis, cause unspecified   . Dyslipidemia     high HDL and LDL    Review of Systems     Objective:   Physical Exam        Assessment & Plan:    Dementia, rapidly progressive Noncompliance with prescribed therapy Poor insight and markedly impaired judgement due to dementia  I agree with family on need for driver's licence restrictions or revoked licence due to need for patient and public safety - they will contact the DMV and I will sign a medical statement attesting to same.  Time spent with pt's family today 20 minutes, 100% time spent counseling on patient's dementia

## 2012-05-23 ENCOUNTER — Ambulatory Visit (INDEPENDENT_AMBULATORY_CARE_PROVIDER_SITE_OTHER): Payer: Medicare Other | Admitting: Internal Medicine

## 2012-05-23 ENCOUNTER — Encounter: Payer: Self-pay | Admitting: Internal Medicine

## 2012-05-23 VITALS — BP 132/68 | HR 71 | Temp 98.1°F | Wt 154.0 lb

## 2012-05-23 DIAGNOSIS — I10 Essential (primary) hypertension: Secondary | ICD-10-CM

## 2012-05-23 DIAGNOSIS — F0390 Unspecified dementia without behavioral disturbance: Secondary | ICD-10-CM

## 2012-05-23 NOTE — Assessment & Plan Note (Signed)
Started low dose amlodipine for same 05/2010 - titrated up 07/2010 -?compliance The current medical regimen is effective;  continue present plan and medications.  BP Readings from Last 3 Encounters:  05/23/12 132/68  03/28/12 122/72  03/04/12 142/72

## 2012-05-23 NOTE — Progress Notes (Signed)
  Subjective:    Patient ID: Katie Hansen, female    DOB: 1927/12/09, 79 y.o.   MRN: [id number]  HPI here for followup - reviewed chronic medical issues:  dementia/ memory loss, progressive per family and ALF reports (see OV with family 04/2012 re: same) - previously rx'd Aricept, but self dc'd 02/2010 due to concern for side effects. denies adverse side effects related to current therapy. no sig change in symptoms per pt dtr while taking med- started Namenda 12/2010, max dose 04/2011, but noncompliant with same.   HTN - started amlodipine for same 05/2010, dose increase 08/2010, but ?compliance due to dementia. no headache, vision change or edema   allergic rhinitis - on OTC antihist and nasal steroid spray for same  no sinus pressure, discolored nasal discharge or fever - no sore throat  -  the patient reports compliance with medication(s) as prescribed. Denies adverse side effects.  OA - PIP left index finger with ache - no injury, swelling or locking  Osteoporosis, DEXA 04/2011 reviewed - rx'd Fosamax weekly, but not consistently taking same - no fracture hx or new back pain/falls   Past Medical History  Diagnosis Date  . Dementia   . Hypertension   . Allergic rhinitis, cause unspecified   . Dyslipidemia     high HDL and LDL    Review of Systems  Constitutional: Negative for fever, fatigue and unexpected weight change.  Respiratory: Negative for cough and shortness of breath.   Cardiovascular: Negative for chest pain and palpitations.  Neurological: Negative for headaches.  Psychiatric/Behavioral: Negative for hallucinations, behavioral problems and dysphoric mood. The patient is not nervous/anxious and is not hyperactive.        Objective:   Physical Exam  BP 132/68  Pulse 71  Temp(Src) 98.1 F (36.7 C) (Oral)  Wt 154 lb (69.854 kg)  BMI 25.63 kg/m2  SpO2 97% Wt Readings from Last 3 Encounters:  05/23/12 154 lb (69.854 kg)  03/28/12 150 lb 1.9 oz (68.094 kg)   03/04/12 153 lb (69.4 kg)   Constitutional: She appears well-developed and well-nourished. No distress.  Dtr at side Neck: Normal range of motion. Neck supple. No JVD present. No thyromegaly present.  Cardiovascular: Normal rate, regular rhythm and normal heart sounds.  No murmur heard. trace BLE edema at ankles. Pulmonary/Chest: Effort normal and breath sounds normal. No respiratory distress. She has no wheezes.  Neuro: 0/3 recall at 3 minutes - oriented to self but not time, place or purpose. Looks to dtr for assist with hx/direction Psychiatric: She has a normal mood and affect. Her behavior is normal. Judgment and thought content normal.   Lab Results  Component Value Date   WBC 7.6 02/27/2009   HGB 13.7 02/27/2009   HCT 40.7 02/27/2009   PLT 236.0 02/27/2009   CHOL 283* 12/29/2010   TRIG 220.0* 12/29/2010   HDL 73.80 12/29/2010   LDLDIRECT 181.1 12/29/2010   ALT 17 02/27/2009   AST 20 02/27/2009   NA 144 02/27/2009   K 4.5 02/27/2009   CL 105 02/27/2009   CREATININE 0.7 02/27/2009   BUN 15 02/27/2009   CO2 32 02/27/2009   TSH 2.60 12/29/2010       Assessment & Plan:  See problem list. Medications and labs reviewed today.  Time spent with pt/family today 25 minutes, greater than 50% time spent counseling patient on progressive dementia, driving concerns and medication review. Also review of prior records

## 2012-05-23 NOTE — Assessment & Plan Note (Signed)
labs, head ct reviewed 02/2009- normal  aricept stopped indep by pt summer 2012 due to "paper in mail telling me its top 10 dangerous medication" Started low dose nameda 12/2010, maximized dose 04/2011 Added galatamine 11/2011, but strongly suspect noncompliance as per family report Progressive symptoms with increasing erratic behavior Advised pt against driving for safety reasons - pt does not agree, but i will sign DMV papers stating same (see prior OV with family re: concerns)

## 2012-05-23 NOTE — Patient Instructions (Addendum)
It was good to see you today. We have reviewed your prior records including labs and tests today Medications reviewed, no changes at this time. Medically, I do not think you should continue to drive. Your family and friends can transport you where you need to go.  Please schedule followup in 6 months for blood pressure and memory recheck, call sooner if problems.

## 2012-07-12 ENCOUNTER — Other Ambulatory Visit: Payer: Self-pay | Admitting: Internal Medicine

## 2012-07-15 ENCOUNTER — Other Ambulatory Visit: Payer: Self-pay | Admitting: Internal Medicine

## 2012-08-12 ENCOUNTER — Other Ambulatory Visit: Payer: Self-pay | Admitting: Internal Medicine

## 2012-09-13 ENCOUNTER — Telehealth: Payer: Self-pay | Admitting: *Deleted

## 2012-09-13 NOTE — Telephone Encounter (Signed)
Pts daughter, [name] called requesting a letter be sent to [facility] requesting pts License be revoked for the pts safety.  Please advise

## 2012-09-13 NOTE — Telephone Encounter (Signed)
Generated letter has been fax to [facility]. Called daughter no answer [facility] letter also ready for pick-up...Raechel Chute

## 2012-09-13 NOTE — Telephone Encounter (Signed)
Ok to generate letter as here -please send to pt (or dtr can pick up copy) and fax to [facility]:  [facility]  [address] [place], [place] [address]   Phone: [phone]  Fax: [phone]    09/13/12   Dear Ms. Cousineau and DMV,  It is my medical opinon that, due to your dementia and progressive memory problems, you should not be permitted to drive. We discussed this at our visit 05/23/12. Driving with your medical impairment presents a risk to your personal saftey as well as to others on the road. Therefore, as your personal physician, I feel you should not drive.  With this letter, I have informed both you and DMV of my concerns.   Sincerely,   Rene Paci MD   Fax DMV [phone]

## 2012-10-26 ENCOUNTER — Telehealth: Payer: Self-pay | Admitting: *Deleted

## 2012-10-26 NOTE — Telephone Encounter (Signed)
Left msg on vm stating md had sent a letter to [facility] trying to get mom driving priviledges taking away. DMV has mail some additional form for md to fill out wanting to know can she bring forms or do she need appt. Called Jennifer back no answer LMOM can drop form off @ front desk & if there will be a charge we will let her know once md complete forms...lmb

## 2012-11-23 ENCOUNTER — Encounter: Payer: Self-pay | Admitting: Internal Medicine

## 2012-11-23 ENCOUNTER — Ambulatory Visit (INDEPENDENT_AMBULATORY_CARE_PROVIDER_SITE_OTHER): Payer: Medicare Other | Admitting: Internal Medicine

## 2012-11-23 VITALS — BP 132/68 | HR 77 | Temp 97.4°F | Wt 150.8 lb

## 2012-11-23 DIAGNOSIS — F0390 Unspecified dementia without behavioral disturbance: Secondary | ICD-10-CM

## 2012-11-23 DIAGNOSIS — I10 Essential (primary) hypertension: Secondary | ICD-10-CM

## 2012-11-23 DIAGNOSIS — M810 Age-related osteoporosis without current pathological fracture: Secondary | ICD-10-CM

## 2012-11-23 DIAGNOSIS — Z23 Encounter for immunization: Secondary | ICD-10-CM

## 2012-11-23 NOTE — Assessment & Plan Note (Signed)
Prescribed fosamax 04/2011 following DEXA 04/2011 -2.5 - but not taking same No need to re-rx at this time weighing risks of age with comorbid dz  On Ca+Vit D daily and WB exercise encouraged no hx falls

## 2012-11-23 NOTE — Assessment & Plan Note (Signed)
labs, head ct reviewed 02/2009- normal  aricept stopped indep by pt summer 2012 due to "paper in mail telling me its top 10 dangerous medication" Started low dose nameda 12/2010, maximized dose 04/2011 Added galatamine 11/2011, but ?suspect noncompliance as per prior family report stable symptoms with occassional erratic behavior Contact with DMV summer 2014 to end driving privileges reviewed

## 2012-11-23 NOTE — Patient Instructions (Signed)
It was good to see you today. We have reviewed your prior records including labs and tests today Medications reviewed and updated, no changes recommended at this time. Flu shot today Please schedule followup in 6 months, call sooner if problems.

## 2012-11-23 NOTE — Progress Notes (Signed)
Subjective: Patient is here for follow up of chronic medical issues-  Dementia/memory loss- progressive per family and patient.  She had previously been on Aricept but discontinued this medication in December 2011 because of concerns about side effects.  Currently on Namenda (started 10/12)- states compliance.    Hypertension- started Amlodipine 3/12, dose increased 6/12.  She denies headaches, edema, visual changes.  Osteoporosis- DEXA 04/2011- she has not been taking Fosamax. She is unsure why she discontinued this medication.    Past Medical History  Diagnosis Date  . Dementia   . Hypertension   . Allergic rhinitis, cause unspecified   . Dyslipidemia     high HDL and LDL    Review of Symptoms: Constitutional: Negative for fever, fatigue and unexpected weight change.  Respiratory: Negative for cough and shortness of breath.  Cardiovascular: Negative for chest pain and palpitations.  Neurological: Negative for headaches.  Psychiatric/Behavioral: Negative for hallucinations, behavioral problems and dysphoric mood. The patient is not nervous/anxious and is not hyperactive.    Physical Exam: BP 132/68  Pulse 77  Temp(Src) 97.4 F (36.3 C) (Oral)  Wt 150 lb 12.8 oz (68.402 kg)  BMI 25.09 kg/m2  SpO2 96% Wt Readings from Last 3 Encounters:  11/23/12 150 lb 12.8 oz (68.402 kg)  05/23/12 154 lb (69.854 kg)  03/28/12 150 lb 1.9 oz (68.094 kg)   Constitutional: She appears well-developed and well-nourished. No distress. Dtr at side Neck: Normal range of motion. Neck supple. No JVD present. No thyromegaly present.  Cardiovascular: Normal rate, regular rhythm and normal heart sounds.  No murmur heard. No BLE edema. No carotid bruit.  Pulmonary/Chest: Effort normal and breath sounds normal. No respiratory distress. She has no wheezes.   Psychiatric: She has a normal mood and affect. Her behavior is normal.    Lab Results  Component Value Date   WBC 7.6 02/27/2009   HGB 13.7  02/27/2009   HCT 40.7 02/27/2009   PLT 236.0 02/27/2009   GLUCOSE 103* 02/27/2009   CHOL 283* 12/29/2010   TRIG 220.0* 12/29/2010   HDL 73.80 12/29/2010   LDLDIRECT 181.1 12/29/2010   ALT 17 02/27/2009   AST 20 02/27/2009   NA 144 02/27/2009   K 4.5 02/27/2009   CL 105 02/27/2009   CREATININE 0.7 02/27/2009   BUN 15 02/27/2009   CO2 32 02/27/2009   TSH 2.60 12/29/2010    A/p: See problem list. Medications and labs reviewed today.  Time spent with pt/family today 25 minutes, greater than 50% time spent counseling patient on dementia, hypertension amd osteoporosis + medication review. Also review of prior records

## 2012-11-23 NOTE — Assessment & Plan Note (Signed)
Started low dose amlodipine for same 05/2010 - titrated up 07/2010 -?compliance The current medical regimen is effective;  continue present plan and medications.  BP Readings from Last 3 Encounters:  11/23/12 132/68  05/23/12 132/68  03/28/12 122/72

## 2013-03-22 ENCOUNTER — Other Ambulatory Visit: Payer: Self-pay | Admitting: Internal Medicine

## 2013-05-18 ENCOUNTER — Ambulatory Visit: Payer: Medicare Other | Admitting: Internal Medicine

## 2013-05-24 ENCOUNTER — Ambulatory Visit: Payer: Medicare Other | Admitting: Internal Medicine

## 2013-05-29 ENCOUNTER — Telehealth: Payer: Self-pay | Admitting: *Deleted

## 2013-05-29 ENCOUNTER — Encounter: Payer: Self-pay | Admitting: Internal Medicine

## 2013-05-29 ENCOUNTER — Ambulatory Visit (INDEPENDENT_AMBULATORY_CARE_PROVIDER_SITE_OTHER): Payer: Medicare Other | Admitting: Internal Medicine

## 2013-05-29 VITALS — BP 128/82 | HR 66 | Temp 98.5°F | Wt 152.0 lb

## 2013-05-29 DIAGNOSIS — F0390 Unspecified dementia without behavioral disturbance: Secondary | ICD-10-CM

## 2013-05-29 DIAGNOSIS — I10 Essential (primary) hypertension: Secondary | ICD-10-CM

## 2013-05-29 DIAGNOSIS — Z0000 Encounter for general adult medical examination without abnormal findings: Secondary | ICD-10-CM

## 2013-05-29 MED ORDER — AMLODIPINE BESYLATE 5 MG PO TABS: ORAL_TABLET | ORAL | Status: DC

## 2013-05-29 NOTE — Assessment & Plan Note (Signed)
labs, head ct reviewed 02/2009- normal  aricept stopped indep by pt summer 2012 due to "paper in mail telling me its top 10 dangerous medication" Started low dose nameda 12/2010, maximized dose 04/2011 Added galatamine 11/2011, but ?suspect noncompliance as per prior family report stable symptoms with occassional erratic behavior Contacted with DMV summer 2014 to end driving privileges reviewed

## 2013-05-29 NOTE — Patient Instructions (Addendum)
It was good to see you today.  Health Maintenance reviewed - all recommended immunizations and age-appropriate screenings are up-to-date.  We have reviewed your prior records including labs and tests today  Medications reviewed and updated, no changes recommended at this time.  Please schedule followup in 6 months, call sooner if problems.  Health Maintenance, Female A healthy lifestyle and preventative care can promote health and wellness.  Maintain regular health, dental, and eye exams.  Eat a healthy diet. Foods like vegetables, fruits, whole grains, low-fat dairy products, and lean protein foods contain the nutrients you need without too many calories. Decrease your intake of foods high in solid fats, added sugars, and salt. Get information about a proper diet from your caregiver, if necessary.  Regular physical exercise is one of the most important things you can do for your health. Most adults should get at least 150 minutes of moderate-intensity exercise (any activity that increases your heart rate and causes you to sweat) each week. In addition, most adults need muscle-strengthening exercises on 2 or more days a week.   Maintain a healthy weight. The body mass index (BMI) is a screening tool to identify possible weight problems. It provides an estimate of body fat based on height and weight. Your caregiver can help determine your BMI, and can help you achieve or maintain a healthy weight. For adults 20 years and older:  A BMI below 18.5 is considered underweight.  A BMI of 18.5 to 24.9 is normal.  A BMI of 25 to 29.9 is considered overweight.  A BMI of 30 and above is considered obese.  Maintain normal blood lipids and cholesterol by exercising and minimizing your intake of saturated fat. Eat a balanced diet with plenty of fruits and vegetables. Blood tests for lipids and cholesterol should begin at age 59 and be repeated every 5 years. If your lipid or cholesterol levels are  high, you are over 50, or you are a high risk for heart disease, you may need your cholesterol levels checked more frequently.Ongoing high lipid and cholesterol levels should be treated with medicines if diet and exercise are not effective.  If you smoke, find out from your caregiver how to quit. If you do not use tobacco, do not start.  Lung cancer screening is recommended for adults aged 27 80 years who are at high risk for developing lung cancer because of a history of smoking. Yearly low-dose computed tomography (CT) is recommended for people who have at least a 30-pack-year history of smoking and are a current smoker or have quit within the past 15 years. A pack year of smoking is smoking an average of 1 pack of cigarettes a day for 1 year (for example: 1 pack a day for 30 years or 2 packs a day for 15 years). Yearly screening should continue until the smoker has stopped smoking for at least 15 years. Yearly screening should also be stopped for people who develop a health problem that would prevent them from having lung cancer treatment.  If you are pregnant, do not drink alcohol. If you are breastfeeding, be very cautious about drinking alcohol. If you are not pregnant and choose to drink alcohol, do not exceed 1 drink per day. One drink is considered to be 12 ounces (355 mL) of beer, 5 ounces (148 mL) of wine, or 1.5 ounces (44 mL) of liquor.  Avoid use of street drugs. Do not share needles with anyone. Ask for help if you need support or  instructions about stopping the use of drugs.  High blood pressure causes heart disease and increases the risk of stroke. Blood pressure should be checked at least every 1 to 2 years. Ongoing high blood pressure should be treated with medicines, if weight loss and exercise are not effective.  If you are 59 to 78 years old, ask your caregiver if you should take aspirin to prevent strokes.  Diabetes screening involves taking a blood sample to check your fasting  blood sugar level. This should be done once every 3 years, after age 61, if you are within normal weight and without risk factors for diabetes. Testing should be considered at a younger age or be carried out more frequently if you are overweight and have at least 1 risk factor for diabetes.  Breast cancer screening is essential preventative care for women. You should practice "breast self-awareness." This means understanding the normal appearance and feel of your breasts and may include breast self-examination. Any changes detected, no matter how small, should be reported to a caregiver. Women in their 74s and 30s should have a clinical breast exam (CBE) by a caregiver as part of a regular health exam every 1 to 3 years. After age 70, women should have a CBE every year. Starting at age 49, women should consider having a mammogram (breast X-ray) every year. Women who have a family history of breast cancer should talk to their caregiver about genetic screening. Women at a high risk of breast cancer should talk to their caregiver about having an MRI and a mammogram every year.  Breast cancer gene (BRCA)-related cancer risk assessment is recommended for women who have family members with BRCA-related cancers. BRCA-related cancers include breast, ovarian, tubal, and peritoneal cancers. Having family members with these cancers may be associated with an increased risk for harmful changes (mutations) in the breast cancer genes BRCA1 and BRCA2. Results of the assessment will determine the need for genetic counseling and BRCA1 and BRCA2 testing.  The Pap test is a screening test for cervical cancer. Women should have a Pap test starting at age 89. Between ages 67 and 63, Pap tests should be repeated every 2 years. Beginning at age 41, you should have a Pap test every 3 years as long as the past 3 Pap tests have been normal. If you had a hysterectomy for a problem that was not cancer or a condition that could lead to  cancer, then you no longer need Pap tests. If you are between ages 63 and 33, and you have had normal Pap tests going back 10 years, you no longer need Pap tests. If you have had past treatment for cervical cancer or a condition that could lead to cancer, you need Pap tests and screening for cancer for at least 20 years after your treatment. If Pap tests have been discontinued, risk factors (such as a new sexual partner) need to be reassessed to determine if screening should be resumed. Some women have medical problems that increase the chance of getting cervical cancer. In these cases, your caregiver may recommend more frequent screening and Pap tests.  The human papillomavirus (HPV) test is an additional test that may be used for cervical cancer screening. The HPV test looks for the virus that can cause the cell changes on the cervix. The cells collected during the Pap test can be tested for HPV. The HPV test could be used to screen women aged 72 years and older, and should be used in women  of any age who have unclear Pap test results. After the age of 29, women should have HPV testing at the same frequency as a Pap test.  Colorectal cancer can be detected and often prevented. Most routine colorectal cancer screening begins at the age of 68 and continues through age 52. However, your caregiver may recommend screening at an earlier age if you have risk factors for colon cancer. On a yearly basis, your caregiver may provide home test kits to check for hidden blood in the stool. Use of a small camera at the end of a tube, to directly examine the colon (sigmoidoscopy or colonoscopy), can detect the earliest forms of colorectal cancer. Talk to your caregiver about this at age 18, when routine screening begins. Direct examination of the colon should be repeated every 5 to 10 years through age 57, unless early forms of pre-cancerous polyps or small growths are found.  Hepatitis C blood testing is recommended for  all people born from [birth year] through 1963 and any individual with known risks for hepatitis C.  Practice safe sex. Use condoms and avoid high-risk sexual practices to reduce the spread of sexually transmitted infections (STIs). Sexually active women aged 17 and younger should be checked for Chlamydia, which is a common sexually transmitted infection. Older women with new or multiple partners should also be tested for Chlamydia. Testing for other STIs is recommended if you are sexually active and at increased risk.  Osteoporosis is a disease in which the bones lose minerals and strength with aging. This can result in serious bone fractures. The risk of osteoporosis can be identified using a bone density scan. Women ages 33 and over and women at risk for fractures or osteoporosis should discuss screening with their caregivers. Ask your caregiver whether you should be taking a calcium supplement or vitamin D to reduce the rate of osteoporosis.  Menopause can be associated with physical symptoms and risks. Hormone replacement therapy is available to decrease symptoms and risks. You should talk to your caregiver about whether hormone replacement therapy is right for you.  Use sunscreen. Apply sunscreen liberally and repeatedly throughout the day. You should seek shade when your shadow is shorter than you. Protect yourself by wearing long sleeves, pants, a wide-brimmed hat, and sunglasses year round, whenever you are outdoors.  Notify your caregiver of new moles or changes in moles, especially if there is a change in shape or color. Also notify your caregiver if a mole is larger than the size of a pencil eraser.  Stay current with your immunizations. Document Released: 09/22/2010 Document Revised: 07/04/2012 Document Reviewed: 09/22/2010 [facility] Patient Information 2014 [place].

## 2013-05-29 NOTE — Progress Notes (Signed)
Subjective:    Patient ID: Katie Hansen, female    DOB: 05/13/1927, 80 y.o.   MRN: [id number]  HPI Patient is here for annual exam and follow up of chronic medical issues-   Dementia/memory loss- progressive per family and patient. She had previously been on Aricept but discontinued this medication in December 2011 because of concerns about side effects. Currently on Namenda (started 10/12)- states compliance. Daughter still with concerns about progressive memory loss  Hypertension- started Amlodipine 3/12, dose increased 6/12. She denies headaches, edema, chest pain, shortness of breath, visual changes. Reports compliance with current therapy.   Osteoporosis- DEXA 04/2011- she has not been taking Fosamax. She is unsure why she discontinued this medication.  She is walking 4 times daily and states compliance with Calcium/Vit D  Past Medical History  Diagnosis Date  . Dementia   . Hypertension   . Allergic rhinitis, cause unspecified   . Dyslipidemia     high HDL and LDL   Family History  Problem Relation Age of Onset  . Colon cancer Mother 2863   History  Substance Use Topics  . Smoking status: Never Smoker   . Smokeless tobacco: Not on file     Comment: Widowed for many years-spouse died in plane crash. 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 moved to [place] indep living [place]) in 08/2008 to be closer to dtr-lives alone  . Alcohol Use: No    Review of Systems  Constitutional: Negative for fever, chills, activity change, appetite change, fatigue and unexpected weight change.  HENT: Negative for congestion, postnasal drip, rhinorrhea, sinus pressure and sore throat.   Respiratory: Negative for cough, chest tightness, shortness of breath and wheezing.   Cardiovascular: Negative for chest pain, palpitations and leg swelling.  Gastrointestinal: Negative for nausea, vomiting, abdominal pain, diarrhea and  constipation.  Neurological: Negative for dizziness, syncope, weakness, light-headedness and headaches.  Psychiatric/Behavioral: Negative for dysphoric mood. The patient is not nervous/anxious.   All other systems reviewed and are negative.       Objective:   Physical Exam  Constitutional: She appears well-developed and well-nourished. No distress.  HENT:  Head: Normocephalic and atraumatic.  Eyes: Conjunctivae are normal. Pupils are equal, round, and reactive to light. Right eye exhibits no discharge. Left eye exhibits no discharge. No scleral icterus.  Neck: Normal range of motion. Neck supple. No thyromegaly present.  Cardiovascular: Normal rate, regular rhythm and normal heart sounds.   No murmur heard. Pulmonary/Chest: Effort normal and breath sounds normal. No respiratory distress. She has no wheezes.  Abdominal: Soft. Bowel sounds are normal.  Lymphadenopathy:    She has no cervical adenopathy.  Neurological: She is alert.  Skin: Skin is warm and dry. She is not diaphoretic.  Psychiatric: She has a normal mood and affect. Her behavior is normal. Judgment and thought content normal.    BP Readings from Last 3 Encounters:  05/29/13 128/82  11/23/12 132/68  05/23/12 132/68   Wt Readings from Last 3 Encounters:  05/29/13 152 lb (68.947 kg)  11/23/12 150 lb 12.8 oz (68.402 kg)  05/23/12 154 lb (69.854 kg)   Lab Results  Component Value Date   WBC 7.6 02/27/2009   HGB 13.7 02/27/2009   HCT 40.7 02/27/2009   PLT 236.0 02/27/2009   GLUCOSE 103* 02/27/2009   CHOL 283* 12/29/2010   TRIG 220.0* 12/29/2010   HDL 73.80 12/29/2010   LDLDIRECT 181.1 12/29/2010  ALT 17 02/27/2009   AST 20 02/27/2009   NA 144 02/27/2009   K 4.5 02/27/2009   CL 105 02/27/2009   CREATININE 0.7 02/27/2009   BUN 15 02/27/2009   CO2 32 02/27/2009   TSH 2.60 12/29/2010       Assessment & Plan:   CPX/v70.0 - Patient has been counseled on age-appropriate routine health concerns for screening and prevention.  These are reviewed and up-to-date. Immunizations are up-to-date or declined. Labs reviewed. Other testing declined due to age and dementia   Problem List Items Addressed This Visit   Dementia without behavioral disturbance - Primary     labs, head ct reviewed 02/2009- normal  aricept stopped indep by pt summer 2012 due to "paper in mail telling me its top 10 dangerous medication" Started low dose nameda 12/2010, maximized dose 04/2011 Added galatamine 11/2011, but ?suspect noncompliance as per prior family report stable symptoms with occassional erratic behavior Contacted with DMV summer 2014 to end driving privileges reviewed      HYPERTENSION      Started low dose amlodipine for same 05/2010 - titrated up 07/2010 -?compliance The current medical regimen is effective;  continue present plan and medications.  BP Readings from Last 3 Encounters:  05/29/13 128/82  11/23/12 132/68  05/23/12 132/68

## 2013-05-29 NOTE — Assessment & Plan Note (Signed)
Started low dose amlodipine for same 05/2010 - titrated up 07/2010 -?compliance The current medical regimen is effective;  continue present plan and medications.  BP Readings from Last 3 Encounters:  05/29/13 128/82  11/23/12 132/68  05/23/12 132/68

## 2013-05-29 NOTE — Progress Notes (Signed)
Pre-visit discussion using our clinic review tool. No additional management support is needed unless otherwise documented below in the visit note.  

## 2013-05-29 NOTE — Telephone Encounter (Signed)
Victorino DikeJennifer phoned stating patient needing refill on amlodipine.  OV this morning with PCP, refilled per protocol and notified family member.

## 2013-07-13 ENCOUNTER — Encounter: Payer: Self-pay | Admitting: Internal Medicine

## 2013-07-13 ENCOUNTER — Ambulatory Visit (INDEPENDENT_AMBULATORY_CARE_PROVIDER_SITE_OTHER): Payer: Medicare Other | Admitting: Internal Medicine

## 2013-07-13 ENCOUNTER — Ambulatory Visit (INDEPENDENT_AMBULATORY_CARE_PROVIDER_SITE_OTHER)
Admission: RE | Admit: 2013-07-13 | Discharge: 2013-07-13 | Disposition: A | Discharge status: Home / Self Care | Payer: Medicare Other | Source: Ambulatory Visit | Attending: Internal Medicine | Admitting: Internal Medicine

## 2013-07-13 VITALS — BP 120/72 | HR 74 | Temp 98.2°F | Wt 151.2 lb

## 2013-07-13 DIAGNOSIS — M25512 Pain in left shoulder: Secondary | ICD-10-CM

## 2013-07-13 DIAGNOSIS — M25519 Pain in unspecified shoulder: Secondary | ICD-10-CM

## 2013-07-13 DIAGNOSIS — S43429A Sprain of unspecified rotator cuff capsule, initial encounter: Secondary | ICD-10-CM

## 2013-07-13 MED ORDER — MELOXICAM 15 MG PO TABS: 15.0000 mg | ORAL_TABLET | Freq: Every day | ORAL | Status: DC

## 2013-07-13 NOTE — Patient Instructions (Addendum)
It was good to see you today.  We have reviewed your prior records including labs and tests today  Test(s) ordered today. Your results will be released to MyChart (or called to you) after review, usually within 72hours after test completion. If any changes need to be made, you will be notified at that same time.  Medications reviewed and updated meloxicam 15mg  daily x 2 weeks for shoulder pain, then as needed - No other changes Your prescription(s) have been submitted to your pharmacy. Please take as directed and contact our office if you believe you are having problem(s) with the medication(s).  Please let me know if unimproved in next 2-4 weeks, sooner if worse  Rotator Cuff Injury Rotator cuff injury is any type of injury to the set of muscles and tendons that make up the stabilizing unit of your shoulder. This unit holds the ball of your upper arm bone (humerus) in the socket of your shoulder blade (scapula).  CAUSES Injuries to your rotator cuff most commonly come from sports or activities that cause your arm to be moved repeatedly over your head. Examples of this include throwing, weight lifting, swimming, or racquet sports. Long lasting (chronic) irritation of your rotator cuff can cause soreness and swelling (inflammation), bursitis, and eventual damage to your tendons, such as a tear (rupture). SIGNS AND SYMPTOMS Acute rotator cuff tear:  Sudden tearing sensation followed by severe pain shooting from your upper shoulder down your arm toward your elbow.  Decreased range of motion of your shoulder because of pain and muscle spasm.  Severe pain.  Inability to raise your arm out to the side because of pain and loss of muscle power (large tears). Chronic rotator cuff tear:  Pain that usually is worse at night and may interfere with sleep.  Gradual weakness and decreased shoulder motion as the pain worsens.  Decreased range of motion. Rotator cuff tendinitis:  Deep ache in  your shoulder and the outside upper arm over your shoulder.  Pain that comes on gradually and becomes worse when lifting your arm to the side or turning it inward. DIAGNOSIS Rotator cuff injury is diagnosed through a medical history, physical exam, and imaging exam. The medical history helps determine the type of rotator cuff injury. Your health care provider will look at your injured shoulder, feel the injured area, and ask you to move your shoulder in different positions. X-ray exams typically are done to rule out other causes of shoulder pain, such as fractures. MRI is the exam of choice for the most severe shoulder injuries because the images show muscles and tendons.  TREATMENT  Chronic tear:  Medicine for pain, such as acetaminophen or ibuprofen.  Physical therapy and range-of-motion exercises may be helpful in maintaining shoulder function and strength.  Steroid injections into your shoulder joint.  Surgical repair of the rotator cuff if the injury does not heal with noninvasive treatment. Acute tear:  Anti-inflammatory medicines such as ibuprofen and naproxen to help reduce pain and swelling.  A sling to help support your arm and rest your rotator cuff muscles. Long-term use of a sling is not advised. It may cause significant stiffening of the shoulder joint.  Surgery may be considered within a few weeks, especially in younger, active people, to return the shoulder to full function.  Indications for surgical treatment include the following:  Age younger than 60 years.  Rotator cuff tears that are complete.  Physical therapy, rest, and anti-inflammatory medicines have been used for 6 8  weeks, with no improvement.  Employment or sporting activity that requires constant shoulder use. Tendinitis:  Anti-inflammatory medicines such as ibuprofen and naproxen to help reduce pain and swelling.  A sling to help support your arm and rest your rotator cuff muscles. Long-term use of a  sling is not advised. It may cause significant stiffening of the shoulder joint.  Severe tendinitis may require:  Steroid injections into your shoulder joint.  Physical therapy.  Surgery. HOME CARE INSTRUCTIONS   Apply ice to your injury:  Put ice in a plastic bag.  Place a towel between your skin and the bag.  Leave the ice on for 20 minutes, 2 3 times a day.  If you have a shoulder immobilizer (sling and straps), wear it until told otherwise by your health care provider.  You may want to sleep on several pillows or in a recliner at night to lessen swelling and pain.  Only take over-the-counter or prescription medicines for pain, discomfort, or fever as directed by your health care provider.  Do simple hand squeezing exercises with a soft rubber ball to decrease hand swelling. SEEK MEDICAL CARE IF:   Your shoulder pain increases, or new pain or numbness develops in your arm, hand, or fingers.  Your hand or fingers are colder than your other hand. SEEK IMMEDIATE MEDICAL CARE IF:   Your arm, hand, or fingers are numb or tingling.  Your arm, hand, or fingers are increasingly swollen and painful, or they turn white or blue. MAKE SURE YOU:  Understand these instructions.  Will watch your condition.  Will get help right away if you are not doing well or get worse. Document Released: 03/06/2000 Document Revised: 12/28/2012 Document Reviewed: 10/19/2012 [facility] Patient Information 2014 [place].

## 2013-07-13 NOTE — Progress Notes (Signed)
   Subjective:    Patient ID: Katie Hansen, female    DOB: 05/07/1927, 80 y.o.   MRN: [id number]  Shoulder Injury  The incident occurred at home. The left shoulder is affected. The incident occurred 5 to 7 days ago. The injury mechanism was a fall. The quality of the pain is described as aching. The pain radiates to the left arm. The pain is moderate. Pertinent negatives include no chest pain, muscle weakness, numbness or tingling. The symptoms are aggravated by movement and overhead lifting. She has tried nothing for the symptoms.    Also reviewed chronic medical issues and interval medical events  Past Medical History  Diagnosis Date  . Dementia   . Hypertension   . Allergic rhinitis, cause unspecified   . Dyslipidemia     high HDL and LDL    Review of Systems  Constitutional: Negative for fever, fatigue and unexpected weight change.  Respiratory: Negative for cough and shortness of breath.   Cardiovascular: Negative for chest pain.  Neurological: Negative for tingling, syncope, weakness and numbness.       Objective:   Physical Exam  BP 120/72  Pulse 74  Temp(Src) 98.2 F (36.8 C) (Oral)  Wt 151 lb 3.2 oz (68.584 kg)  SpO2 95% Wt Readings from Last 3 Encounters:  07/13/13 151 lb 3.2 oz (68.584 kg)  05/29/13 152 lb (68.947 kg)  11/23/12 150 lb 12.8 oz (68.402 kg)   Constitutional: She appears well-developed and well-nourished. No distress. Dtr at side Neck: Normal range of motion. Neck supple. No JVD present. No thyromegaly present.  Cardiovascular: Normal rate, regular rhythm and normal heart sounds.  No murmur heard. No BLE edema. Pulmonary/Chest: Effort normal and breath sounds normal. No respiratory distress. She has no wheezes.  MSkel: L shoulder without gross deformity - decreased range of motion on forward flexion, abduction, and internal rotation. Positive impingement signs. Decreased strength with stressing of rotator cuff because of pain. Pain with crossed  arm adduction. referred pain into distal deltoid. Tender over a.c. joint and subacromial. Psychiatric: She has a pleasant, normal mood and affect. Her behavior is normal. Judgment and thought content normal.   Lab Results  Component Value Date   WBC 7.6 02/27/2009   HGB 13.7 02/27/2009   HCT 40.7 02/27/2009   PLT 236.0 02/27/2009   GLUCOSE 103* 02/27/2009   CHOL 283* 12/29/2010   TRIG 220.0* 12/29/2010   HDL 73.80 12/29/2010   LDLDIRECT 181.1 12/29/2010   ALT 17 02/27/2009   AST 20 02/27/2009   NA 144 02/27/2009   K 4.5 02/27/2009   CL 105 02/27/2009   CREATININE 0.7 02/27/2009   BUN 15 02/27/2009   CO2 32 02/27/2009   TSH 2.60 12/29/2010    No results found.     Assessment & Plan:   Left shoulder pain. Precipitated by fall within past 5 days, unwitnessed. Reports pain with overhead activity and movement Exam consistent with rotator cuff bursitis Check plain film to exclude fracture, especially with known osteoporosis history and trauma Anti-inflammatory prescribed meloxicam daily for 2 weeks, then as needed Consider steroid injection and/or physical therapy if unimproved with conservative care, family will call sooner if worse or unimproved in next 2-4 weeks

## 2013-07-13 NOTE — Progress Notes (Signed)
Pre visit review using our clinic review tool, if applicable. No additional management support is needed unless otherwise documented below in the visit note. 

## 2013-11-29 ENCOUNTER — Ambulatory Visit: Payer: Medicare Other | Admitting: Internal Medicine

## 2014-02-08 ENCOUNTER — Other Ambulatory Visit (INDEPENDENT_AMBULATORY_CARE_PROVIDER_SITE_OTHER): Payer: Medicare Other

## 2014-02-08 ENCOUNTER — Encounter: Payer: Self-pay | Admitting: Internal Medicine

## 2014-02-08 ENCOUNTER — Telehealth: Payer: Self-pay

## 2014-02-08 ENCOUNTER — Ambulatory Visit (INDEPENDENT_AMBULATORY_CARE_PROVIDER_SITE_OTHER): Payer: Medicare Other | Admitting: Internal Medicine

## 2014-02-08 VITALS — BP 128/62 | HR 65 | Temp 98.1°F | Ht 65.0 in | Wt 161.0 lb

## 2014-02-08 DIAGNOSIS — R739 Hyperglycemia, unspecified: Secondary | ICD-10-CM

## 2014-02-08 DIAGNOSIS — I10 Essential (primary) hypertension: Secondary | ICD-10-CM

## 2014-02-08 DIAGNOSIS — F0390 Unspecified dementia without behavioral disturbance: Secondary | ICD-10-CM

## 2014-02-08 DIAGNOSIS — Z23 Encounter for immunization: Secondary | ICD-10-CM

## 2014-02-08 LAB — LIPID PANEL
CHOLESTEROL: 260 mg/dL — AB (ref 0–200)
HDL: 60.9 mg/dL (ref >39.00)
LDL Cholesterol: 163 mg/dL — ABNORMAL HIGH (ref 0–99)
NonHDL: 199.1
Total CHOL/HDL Ratio: 4
Triglycerides: 181 mg/dL — ABNORMAL HIGH (ref 0.0–149.0)
VLDL: 36.2 mg/dL (ref 0.0–40.0)

## 2014-02-08 LAB — HEMOGLOBIN A1C: HEMOGLOBIN A1C: 6.6 % — AB (ref 4.6–6.5)

## 2014-02-08 NOTE — Assessment & Plan Note (Signed)
labs, head ct reviewed 02/2009- normal  aricept stopped indep by pt summer 2012 due to "paper in mail telling me its top 10 dangerous medication" Started low dose nameda 12/2010, maximized dose 04/2011 Added galatamine 11/2011 noncompliance as per prior family report stable symptoms with occassional erratic behavior - looking for memory care unit to increase safety - my support for same Contacted with [employer]V summer 2014 to end driving privileges

## 2014-02-08 NOTE — Assessment & Plan Note (Signed)
Started low dose amlodipine for same 05/2010 - titrated up 07/2010 - ?compliance The current regimen is effective;  continue present plan and medications.  BP Readings from Last 3 Encounters:  02/08/14 128/62  07/13/13 120/72  05/29/13 128/82

## 2014-02-08 NOTE — Patient Instructions (Addendum)
It was good to see you today.  We have reviewed your prior records including labs and tests today  Your annual flu shot and one time Prevnar (pneumonia 13) was given and/or updated today.  Test(s) ordered today. Your results will be released to MyChart (or called to you) after review, usually within 72hours after test completion. If any changes need to be made, you will be notified at that same time.  Medications reviewed and updated, no changes recommended at this time.  Please schedule followup in 6-12 months, call sooner if problems.

## 2014-02-08 NOTE — Telephone Encounter (Signed)
LVM with pt to call office back   RE: I think that the pt left their necklace here. Found in room after they left.

## 2014-02-08 NOTE — Progress Notes (Signed)
Pre visit review using our clinic review tool, if applicable. No additional management support is needed unless otherwise documented below in the visit note. 

## 2014-02-08 NOTE — Assessment & Plan Note (Signed)
[facility] family report Weight increase noted Check a1c The patient is asked to make an attempt to improve diet and exercise patterns to aid in medical management of this problem.

## 2014-02-08 NOTE — Progress Notes (Signed)
Subjective:    Patient ID: Katie Hansen, female    DOB: 13-Feb-1928, 80 y.o.   MRN: [id number]  HPI  Patient is here for follow up  Reviewed chronic medical issues and interval medical events  Past Medical History  Diagnosis Date  . Dementia   . Hypertension   . Allergic rhinitis, cause unspecified   . Dyslipidemia     high HDL and LDL    Review of Systems  Constitutional: Positive for fatigue and unexpected weight change (gain).  Respiratory: Negative for cough and shortness of breath.   Cardiovascular: Negative for chest pain and leg swelling.       Objective:   Physical Exam  BP 128/62 mmHg  Pulse 65  Temp(Src) 98.1 F (36.7 C) (Oral)  Ht 5\' 5"  (1.651 m)  Wt 161 lb (73.029 kg)  BMI 26.79 kg/m2  SpO2 97% Wt Readings from Last 3 Encounters:  02/08/14 161 lb (73.029 kg)  07/13/13 151 lb 3.2 oz (68.584 kg)  05/29/13 152 lb (68.947 kg)   Constitutional: She appears well-developed and well-nourished. No distress. niece at side Neck: Normal range of motion. Neck supple. No JVD present. No thyromegaly present.  Cardiovascular: Normal rate, regular rhythm and normal heart sounds.  No murmur heard. No BLE edema. Pulmonary/Chest: Effort normal and breath sounds normal. No respiratory distress. She has no wheezes.  Psychiatric: She has a normal mood and affect. Her behavior is normal. Judgment and thought content normal.   Lab Results  Component Value Date   WBC 7.6 02/27/2009   HGB 13.7 02/27/2009   HCT 40.7 02/27/2009   PLT 236.0 02/27/2009   GLUCOSE 103* 02/27/2009   CHOL 283* 12/29/2010   TRIG 220.0* 12/29/2010   HDL 73.80 12/29/2010   LDLDIRECT 181.1 12/29/2010   ALT 17 02/27/2009   AST 20 02/27/2009   NA 144 02/27/2009   K 4.5 02/27/2009   CL 105 02/27/2009   CREATININE 0.7 02/27/2009   BUN 15 02/27/2009   CO2 32 02/27/2009   TSH 2.60 12/29/2010    Dg Shoulder Left  07/13/2013   CLINICAL DATA:  Left upper shoulder pain  EXAM: LEFT SHOULDER - 2+  VIEW  COMPARISON:  None  FINDINGS: Osseous demineralization.  AC joint alignment normal.  No acute fracture, dislocation or bone destruction.  Visualized left ribs intact.  Question calcified pleural plaques left mid chest.  IMPRESSION: Osseous demineralization.  No acute left shoulder abnormalities.  Question calcified pleural plaques in left chest.   Electronically Signed   By: Ulyses SouthwardMark  Boles M.D.   On: 07/13/2013 13:49       Assessment & Plan:   Problem List Items Addressed This Visit    Dementia without behavioral disturbance    labs, head ct reviewed 02/2009- normal  aricept stopped indep by pt summer 2012 due to "paper in mail telling me its top 10 dangerous medication" Started low dose nameda 12/2010, maximized dose 04/2011 Added galatamine 11/2011 noncompliance as per prior family report stable symptoms with occassional erratic behavior - looking for memory care unit to increase safety - my support for same Contacted with [employer]V summer 2014 to end driving privileges     Essential hypertension    Started low dose amlodipine for same 05/2010 - titrated up 07/2010 - ?compliance The current regimen is effective;  continue present plan and medications.  BP Readings from Last 3 Encounters:  02/08/14 128/62  07/13/13 120/72  05/29/13 128/82      Relevant Orders  Lipid panel   Hyperglycemia - Primary    [facility] report Weight increase noted Check a1c The patient is asked to make an attempt to improve diet and exercise patterns to aid in medical management of this problem.     Relevant Orders      Hemoglobin A1c      Lipid panel    Other Visit Diagnoses    Need for prophylactic vaccination and inoculation against influenza        Relevant Orders       Flu Vaccine QUAD 36+ mos PF IM (Fluarix Quad PF) (Completed)

## 2014-02-09 NOTE — Telephone Encounter (Signed)
Pt daughter called back and indicated that it was the pt necklace.

## 2014-07-04 DIAGNOSIS — Z0279 Encounter for issue of other medical certificate: Secondary | ICD-10-CM

## 2014-07-17 ENCOUNTER — Ambulatory Visit (INDEPENDENT_AMBULATORY_CARE_PROVIDER_SITE_OTHER): Payer: Medicare Other | Admitting: *Deleted

## 2014-07-17 DIAGNOSIS — Z23 Encounter for immunization: Secondary | ICD-10-CM | POA: Diagnosis not present

## 2014-07-19 ENCOUNTER — Encounter: Payer: Self-pay | Admitting: *Deleted

## 2014-07-19 LAB — TB SKIN TEST
Induration: 0 mm
TB Skin Test: NEGATIVE

## 2014-08-13 ENCOUNTER — Encounter: Payer: Self-pay | Admitting: Internal Medicine

## 2014-08-13 ENCOUNTER — Ambulatory Visit (INDEPENDENT_AMBULATORY_CARE_PROVIDER_SITE_OTHER): Payer: Medicare Other | Admitting: Internal Medicine

## 2014-08-13 ENCOUNTER — Other Ambulatory Visit (INDEPENDENT_AMBULATORY_CARE_PROVIDER_SITE_OTHER): Payer: Medicare Other

## 2014-08-13 VITALS — BP 138/78 | HR 83 | Temp 98.2°F | Resp 18 | Ht 65.0 in | Wt 165.1 lb

## 2014-08-13 DIAGNOSIS — I10 Essential (primary) hypertension: Secondary | ICD-10-CM

## 2014-08-13 DIAGNOSIS — B351 Tinea unguium: Secondary | ICD-10-CM | POA: Insufficient documentation

## 2014-08-13 DIAGNOSIS — F0390 Unspecified dementia without behavioral disturbance: Secondary | ICD-10-CM

## 2014-08-13 DIAGNOSIS — R739 Hyperglycemia, unspecified: Secondary | ICD-10-CM

## 2014-08-13 DIAGNOSIS — Z23 Encounter for immunization: Secondary | ICD-10-CM

## 2014-08-13 LAB — BASIC METABOLIC PANEL
BUN: 15 mg/dL (ref 6–23)
CHLORIDE: 105 meq/L (ref 96–112)
CO2: 25 mEq/L (ref 19–32)
Calcium: 9.4 mg/dL (ref 8.4–10.5)
Creatinine, Ser: 0.72 mg/dL (ref 0.40–1.20)
GFR: 81.9 mL/min (ref >60.00)
Glucose, Bld: 134 mg/dL — ABNORMAL HIGH (ref 70–99)
Potassium: 3.9 mEq/L (ref 3.5–5.1)
SODIUM: 140 meq/L (ref 135–145)

## 2014-08-13 LAB — MICROALBUMIN / CREATININE URINE RATIO
Creatinine,U: 46.4 mg/dL
MICROALB/CREAT RATIO: 1.5 mg/g (ref 0.0–30.0)
Microalb, Ur: <0.7 mg/dL (ref 0.0–1.9)

## 2014-08-13 LAB — HEMOGLOBIN A1C: HEMOGLOBIN A1C: 6.3 % (ref 4.6–6.5)

## 2014-08-13 NOTE — Assessment & Plan Note (Addendum)
labs, head ct reviewed 02/2009- normal  aricept stopped indep by pt summer 2012 due to "paper in mail telling me its in top 10 of dangerous medications" Started low dose nameda 12/2010, maximized dose 04/2011; then added galatamine 11/2011 noncompliance as per prior family report some medications discontinued from med list stable symptoms with occassional erratic behavior, reviewed and unchanged -at assisted living currently dtr looking for memory care unit to increase safety, but holding on moved to same at this time - my support offered for same Contacted with [employer]V summer 2014 to end driving privileges

## 2014-08-13 NOTE — Assessment & Plan Note (Signed)
[facility] family report Weight increase noted Check a1c - expect to begin metformin for new diagnosis diabetes if A1c greater than 7 The patient is again asked to make an attempt to improve diet and exercise patterns to aid in medical management of this problem, however compliance with same limited by cognitive disorder Lab Results  Component Value Date   HGBA1C 6.6* 02/08/2014

## 2014-08-13 NOTE — Progress Notes (Signed)
Subjective:    Patient ID: Katie Hansen, female    DOB: 06/07/1927, 81 y.o.   MRN: [id number]  HPI  Patient here for follow-up. Reviewed chronic conditions, interval events current concerns  Past Medical History  Diagnosis Date  . Dementia   . Hypertension   . Allergic rhinitis, cause unspecified   . Dyslipidemia     high HDL and LDL    Review of Systems  Constitutional: Positive for fatigue. Negative for unexpected weight change.  Respiratory: Negative for cough and shortness of breath.   Cardiovascular: Negative for chest pain and leg swelling.  Skin:       L foot toenails 1-4 thickened - unable to self manage       Objective:    Physical Exam  Constitutional: She appears well-developed and well-nourished. No distress.  Overweight; dtr at side  Cardiovascular: Normal rate, regular rhythm and normal heart sounds.   No murmur heard. Pulmonary/Chest: Effort normal and breath sounds normal. No respiratory distress.  Musculoskeletal: She exhibits no edema.  Skin:  Thickened overgrown toenails on left foot affecting fourth toe, third toe, second toe and great toe    BP 138/78 mmHg  Pulse 83  Temp(Src) 98.2 F (36.8 C) (Oral)  Resp 18  Ht 5\' 5"  (1.651 m)  Wt 165 lb 1.9 oz (74.898 kg)  BMI 27.48 kg/m2  SpO2 98% Wt Readings from Last 3 Encounters:  08/13/14 165 lb 1.9 oz (74.898 kg)  02/08/14 161 lb (73.029 kg)  07/13/13 151 lb 3.2 oz (68.584 kg)     Lab Results  Component Value Date   WBC 7.6 02/27/2009   HGB 13.7 02/27/2009   HCT 40.7 02/27/2009   PLT 236.0 02/27/2009   GLUCOSE 103* 02/27/2009   CHOL 260* 02/08/2014   TRIG 181.0* 02/08/2014   HDL 60.90 02/08/2014   LDLDIRECT 181.1 12/29/2010   LDLCALC 163* 02/08/2014   ALT 17 02/27/2009   AST 20 02/27/2009   NA 144 02/27/2009   K 4.5 02/27/2009   CL 105 02/27/2009   CREATININE 0.7 02/27/2009   BUN 15 02/27/2009   CO2 32 02/27/2009   TSH 2.60 12/29/2010   HGBA1C 6.6* 02/08/2014    Dg  Shoulder Left  07/13/2013   CLINICAL DATA:  Left upper shoulder pain  EXAM: LEFT SHOULDER - 2+ VIEW  COMPARISON:  None  FINDINGS: Osseous demineralization.  AC joint alignment normal.  No acute fracture, dislocation or bone destruction.  Visualized left ribs intact.  Question calcified pleural plaques left mid chest.  IMPRESSION: Osseous demineralization.  No acute left shoulder abnormalities.  Question calcified pleural plaques in left chest.   Electronically Signed   By: Ulyses SouthwardMark  Boles M.D.   On: 07/13/2013 13:49       Assessment & Plan:   Problem List Items Addressed This Visit    Dementia without behavioral disturbance    labs, head ct reviewed 02/2009- normal  aricept stopped indep by pt summer 2012 due to "paper in mail telling me its in top 10 of dangerous medications" Started low dose nameda 12/2010, maximized dose 04/2011; then added galatamine 11/2011 noncompliance as per prior family report some medications discontinued from med list stable symptoms with occassional erratic behavior, reviewed and unchanged -at assisted living currently dtr looking for memory care unit to increase safety, but holding on moved to same at this time - my support offered for same Contacted with [employer] summer 2014 to end driving privileges  Essential hypertension    BP Readings from Last 3 Encounters:  08/13/14 138/78  02/08/14 128/62  07/13/13 120/72   Started low dose amlodipine for same 05/2010 - titrated up 07/2010 - Discontinued fall 2015 because of noncompliance and lack of benefit The patient is asked to make an attempt to improve diet and exercise patterns to aid in medical management of this problem.       Hyperglycemia - Primary    [facility] report Weight increase noted Check a1c - expect to begin metformin for new diagnosis diabetes if A1c greater than 7 The patient is again asked to make an attempt to improve diet and exercise patterns to aid in  medical management of this problem, however compliance with same limited by cognitive disorder Lab Results  Component Value Date   HGBA1C 6.6* 02/08/2014          Relevant Orders   Hemoglobin A1c   Basic metabolic panel   Microalbumin / creatinine urine ratio   Ambulatory referral to Podiatry   Toenail fungus   Relevant Orders   Ambulatory referral to Podiatry    Other Visit Diagnoses    Need for TD vaccine        Relevant Orders    Td vaccine greater than or equal to 7yo preservative free IM (Completed)        Rene Paci, MD

## 2014-08-13 NOTE — Assessment & Plan Note (Signed)
BP Readings from Last 3 Encounters:  08/13/14 138/78  02/08/14 128/62  07/13/13 120/72   Started low dose amlodipine for same 05/2010 - titrated up 07/2010 - Discontinued fall 2015 because of noncompliance and lack of benefit The patient is asked to make an attempt to improve diet and exercise patterns to aid in medical management of this problem.

## 2014-08-13 NOTE — Progress Notes (Signed)
Pre visit review using our clinic review tool, if applicable. No additional management support is needed unless otherwise documented below in the visit note. 

## 2014-08-13 NOTE — Patient Instructions (Addendum)
It was good to see you today.  We have reviewed your prior records including labs and tests today  Tetanus immunization updated today, good for 10 years  Test(s) ordered today. Your results will be released to MyChart (or called to you) after review, usually within 72hours after test completion. If any changes need to be made, you will be notified at that same time.  Medications reviewed and updated, no changes recommended at this time.  Work on lifestyle changes as discussed (low fat, low carb, increased protein diet; improved exercise efforts; weight loss) to control sugar, blood pressure and cholesterol levels and/or reduce risk of developing other medical problems. Look into [email or web address] or other type of food journal to assist you in this process.  we'll make referral to podiatry for toenail check and management . Our office will contact you regarding appointment(s) once made.  Please schedule followup in 4-6 months, call sooner if problems.

## 2014-08-14 ENCOUNTER — Telehealth: Payer: Self-pay | Admitting: Internal Medicine

## 2014-08-14 NOTE — Telephone Encounter (Signed)
Patient daughter call back about the patient lab results

## 2014-09-13 ENCOUNTER — Ambulatory Visit (INDEPENDENT_AMBULATORY_CARE_PROVIDER_SITE_OTHER): Payer: Medicare Other | Admitting: Podiatry

## 2014-09-13 DIAGNOSIS — B351 Tinea unguium: Secondary | ICD-10-CM | POA: Diagnosis not present

## 2014-09-13 NOTE — Progress Notes (Signed)
   Subjective:    Patient ID: Katie Hansen, female    DOB: [birth year], 81 y.o.   MRN: [id number]  HPI  P tpresents with thickened elongated nails b/l  Review of Systems  All other systems reviewed and are negative.      Objective:   Physical Exam        Assessment & Plan:

## 2014-09-15 NOTE — Progress Notes (Signed)
Subjective:     Patient ID: Katie Hansen, female   DOB: October 21, 1927, 81 y.o.   MRN: [id number]  HPI patient presents with thick nails especially big nails and caregiver stating that she cannot cut these herself but they're nonpainful currently   Review of Systems  All other systems reviewed and are negative.      Objective:   Physical Exam  Constitutional: She is oriented to person, place, and time.  Cardiovascular: Intact distal pulses.   Musculoskeletal: Normal range of motion.  Neurological: She is oriented to person, place, and time.  Skin: Skin is warm.  Nursing note and vitals reviewed.  neurovascular status within normal limits with patient having good digital perfusion and thick yellow nails 1-5 both feet nonpainful when pressed dorsally. Patient is found to have moderate reduction range of motion and muscle strength     Assessment:     Mycotic nail infection bilateral with an absence of pain    Plan:     H&P and condition reviewed including nail removal. At this time I debrided nailbeds and we'll see how this does and decide if anything else is necessary in the future

## 2014-09-18 ENCOUNTER — Telehealth: Payer: Self-pay | Admitting: Internal Medicine

## 2014-09-18 NOTE — Telephone Encounter (Signed)
This has been faxed to laverne.

## 2014-09-18 NOTE — Telephone Encounter (Signed)
[facility] regarding Dr[facility] Fel[facility] but [facility] medication does not show the mg. I did notice these prescriptions are old. Can you fax confirmation on her medications and the dosage amount to Fax [phone] Laverne's phone is [phone]

## 2014-09-20 ENCOUNTER — Telehealth: Payer: Self-pay

## 2014-09-20 NOTE — Telephone Encounter (Signed)
Fax from [facility]  Stated that pt is showing signs of aggitation and anxiety, arguing with staff and family.  Requested rx for lorazepam 5 mg prn for anxiety and trazodone or ativan 0.5 mg tid prn for aggitiation  Fax hard rx to [facility] to be filled if approved. [phone] (Fax)

## 2014-09-21 MED ORDER — LORAZEPAM 0.5 MG PO TABS: 0.5000 mg | ORAL_TABLET | Freq: Two times a day (BID) | ORAL | Status: DC | PRN

## 2014-09-21 NOTE — Telephone Encounter (Signed)
Per written approval from Dr. Felicity CoyerLeschber will okay ativan 0.5 mg BID prn for agitation and recommend visit to evaluate for underlying cause or infection for sudden behavior change.

## 2014-10-17 ENCOUNTER — Telehealth: Payer: Self-pay | Admitting: Internal Medicine

## 2014-10-17 NOTE — Telephone Encounter (Signed)
Disregard- sent to medical records

## 2014-10-25 DIAGNOSIS — R5381 Other malaise: Secondary | ICD-10-CM | POA: Diagnosis not present

## 2014-10-25 DIAGNOSIS — E559 Vitamin D deficiency, unspecified: Secondary | ICD-10-CM | POA: Diagnosis not present

## 2014-10-25 DIAGNOSIS — G309 Alzheimer's disease, unspecified: Secondary | ICD-10-CM | POA: Diagnosis not present

## 2014-10-25 DIAGNOSIS — F419 Anxiety disorder, unspecified: Secondary | ICD-10-CM | POA: Diagnosis not present

## 2014-10-25 DIAGNOSIS — I10 Essential (primary) hypertension: Secondary | ICD-10-CM | POA: Diagnosis not present

## 2014-12-12 DIAGNOSIS — I7389 Other specified peripheral vascular diseases: Secondary | ICD-10-CM | POA: Diagnosis not present

## 2014-12-12 DIAGNOSIS — L605 Yellow nail syndrome: Secondary | ICD-10-CM | POA: Diagnosis not present

## 2014-12-12 DIAGNOSIS — B351 Tinea unguium: Secondary | ICD-10-CM | POA: Diagnosis not present

## 2014-12-27 ENCOUNTER — Ambulatory Visit: Payer: Medicare Other | Admitting: Podiatry

## 2014-12-27 DIAGNOSIS — R5381 Other malaise: Secondary | ICD-10-CM | POA: Diagnosis not present

## 2015-01-08 DIAGNOSIS — I10 Essential (primary) hypertension: Secondary | ICD-10-CM | POA: Diagnosis not present

## 2015-01-08 DIAGNOSIS — G309 Alzheimer's disease, unspecified: Secondary | ICD-10-CM | POA: Diagnosis not present

## 2015-01-08 DIAGNOSIS — E559 Vitamin D deficiency, unspecified: Secondary | ICD-10-CM | POA: Diagnosis not present

## 2015-01-08 DIAGNOSIS — I7389 Other specified peripheral vascular diseases: Secondary | ICD-10-CM | POA: Diagnosis not present

## 2015-02-18 ENCOUNTER — Ambulatory Visit: Payer: Medicare Other | Admitting: Internal Medicine

## 2015-02-21 DIAGNOSIS — I10 Essential (primary) hypertension: Secondary | ICD-10-CM | POA: Diagnosis not present

## 2015-02-21 DIAGNOSIS — B372 Candidiasis of skin and nail: Secondary | ICD-10-CM | POA: Diagnosis not present

## 2015-02-21 DIAGNOSIS — B373 Candidiasis of vulva and vagina: Secondary | ICD-10-CM | POA: Diagnosis not present

## 2015-02-21 DIAGNOSIS — R54 Age-related physical debility: Secondary | ICD-10-CM | POA: Diagnosis not present

## 2015-02-21 DIAGNOSIS — F419 Anxiety disorder, unspecified: Secondary | ICD-10-CM | POA: Diagnosis not present

## 2015-03-06 DIAGNOSIS — Z79899 Other long term (current) drug therapy: Secondary | ICD-10-CM | POA: Diagnosis not present

## 2015-03-12 DIAGNOSIS — G309 Alzheimer's disease, unspecified: Secondary | ICD-10-CM | POA: Diagnosis not present

## 2015-03-12 DIAGNOSIS — I7389 Other specified peripheral vascular diseases: Secondary | ICD-10-CM | POA: Diagnosis not present

## 2015-03-12 DIAGNOSIS — E559 Vitamin D deficiency, unspecified: Secondary | ICD-10-CM | POA: Diagnosis not present

## 2015-03-12 DIAGNOSIS — I10 Essential (primary) hypertension: Secondary | ICD-10-CM | POA: Diagnosis not present

## 2015-03-13 DIAGNOSIS — Z79899 Other long term (current) drug therapy: Secondary | ICD-10-CM | POA: Diagnosis not present

## 2015-03-14 DIAGNOSIS — Z79899 Other long term (current) drug therapy: Secondary | ICD-10-CM | POA: Diagnosis not present

## 2015-03-28 DIAGNOSIS — I10 Essential (primary) hypertension: Secondary | ICD-10-CM | POA: Diagnosis not present

## 2015-03-28 DIAGNOSIS — B349 Viral infection, unspecified: Secondary | ICD-10-CM | POA: Diagnosis not present

## 2015-03-28 DIAGNOSIS — J069 Acute upper respiratory infection, unspecified: Secondary | ICD-10-CM | POA: Diagnosis not present

## 2015-03-28 DIAGNOSIS — R5381 Other malaise: Secondary | ICD-10-CM | POA: Diagnosis not present

## 2015-04-16 DIAGNOSIS — N898 Other specified noninflammatory disorders of vagina: Secondary | ICD-10-CM | POA: Diagnosis not present

## 2015-04-16 DIAGNOSIS — L292 Pruritus vulvae: Secondary | ICD-10-CM | POA: Diagnosis not present

## 2015-04-16 DIAGNOSIS — F0390 Unspecified dementia without behavioral disturbance: Secondary | ICD-10-CM | POA: Diagnosis not present

## 2015-04-18 DIAGNOSIS — N390 Urinary tract infection, site not specified: Secondary | ICD-10-CM | POA: Diagnosis not present

## 2015-04-23 DIAGNOSIS — N898 Other specified noninflammatory disorders of vagina: Secondary | ICD-10-CM | POA: Diagnosis not present

## 2015-04-23 DIAGNOSIS — R54 Age-related physical debility: Secondary | ICD-10-CM | POA: Diagnosis not present

## 2015-04-23 DIAGNOSIS — G309 Alzheimer's disease, unspecified: Secondary | ICD-10-CM | POA: Diagnosis not present

## 2015-04-23 DIAGNOSIS — L292 Pruritus vulvae: Secondary | ICD-10-CM | POA: Diagnosis not present

## 2015-04-23 DIAGNOSIS — I10 Essential (primary) hypertension: Secondary | ICD-10-CM | POA: Diagnosis not present

## 2015-06-19 DIAGNOSIS — L605 Yellow nail syndrome: Secondary | ICD-10-CM | POA: Diagnosis not present

## 2015-06-19 DIAGNOSIS — I7389 Other specified peripheral vascular diseases: Secondary | ICD-10-CM | POA: Diagnosis not present

## 2015-06-19 DIAGNOSIS — B351 Tinea unguium: Secondary | ICD-10-CM | POA: Diagnosis not present

## 2015-07-09 DIAGNOSIS — Z79899 Other long term (current) drug therapy: Secondary | ICD-10-CM | POA: Diagnosis not present

## 2015-07-11 DIAGNOSIS — M2550 Pain in unspecified joint: Secondary | ICD-10-CM | POA: Diagnosis not present

## 2015-07-11 DIAGNOSIS — G309 Alzheimer's disease, unspecified: Secondary | ICD-10-CM | POA: Diagnosis not present

## 2015-07-11 DIAGNOSIS — R54 Age-related physical debility: Secondary | ICD-10-CM | POA: Diagnosis not present

## 2015-07-11 DIAGNOSIS — I10 Essential (primary) hypertension: Secondary | ICD-10-CM | POA: Diagnosis not present

## 2015-07-25 DIAGNOSIS — R54 Age-related physical debility: Secondary | ICD-10-CM | POA: Diagnosis not present

## 2015-07-25 DIAGNOSIS — G309 Alzheimer's disease, unspecified: Secondary | ICD-10-CM | POA: Diagnosis not present

## 2015-10-08 DIAGNOSIS — J309 Allergic rhinitis, unspecified: Secondary | ICD-10-CM | POA: Diagnosis not present

## 2015-10-08 DIAGNOSIS — G309 Alzheimer's disease, unspecified: Secondary | ICD-10-CM | POA: Diagnosis not present

## 2015-10-17 DIAGNOSIS — I10 Essential (primary) hypertension: Secondary | ICD-10-CM | POA: Diagnosis not present

## 2015-10-22 DIAGNOSIS — B351 Tinea unguium: Secondary | ICD-10-CM | POA: Diagnosis not present

## 2015-10-22 DIAGNOSIS — L605 Yellow nail syndrome: Secondary | ICD-10-CM | POA: Diagnosis not present

## 2016-01-15 ENCOUNTER — Encounter: Payer: Self-pay | Admitting: *Deleted

## 2016-01-15 ENCOUNTER — Emergency Department
Admission: EM | Admit: 2016-01-15 | Discharge: 2016-01-15 | Disposition: A | Discharge status: Home / Self Care | Payer: Medicare Other | Attending: Emergency Medicine | Admitting: Emergency Medicine

## 2016-01-15 DIAGNOSIS — N390 Urinary tract infection, site not specified: Secondary | ICD-10-CM | POA: Insufficient documentation

## 2016-01-15 DIAGNOSIS — Z79899 Other long term (current) drug therapy: Secondary | ICD-10-CM | POA: Insufficient documentation

## 2016-01-15 DIAGNOSIS — R4182 Altered mental status, unspecified: Secondary | ICD-10-CM | POA: Diagnosis present

## 2016-01-15 DIAGNOSIS — I10 Essential (primary) hypertension: Secondary | ICD-10-CM | POA: Diagnosis not present

## 2016-01-15 LAB — URINALYSIS COMPLETE WITH MICROSCOPIC (ARMC ONLY)
BILIRUBIN URINE: NEGATIVE
Bacteria, UA: NONE SEEN
Glucose, UA: NEGATIVE mg/dL
HGB URINE DIPSTICK: NEGATIVE
KETONES UR: NEGATIVE mg/dL
Nitrite: NEGATIVE
Protein, ur: NEGATIVE mg/dL
Specific Gravity, Urine: 1.016 (ref 1.005–1.030)
pH: 5 (ref 5.0–8.0)

## 2016-01-15 MED ORDER — CEPHALEXIN 500 MG PO CAPS
500.0000 mg | ORAL_CAPSULE | Freq: Once | ORAL | Status: AC
  Administered 2016-01-15: 500 mg via ORAL
  Filled 2016-01-15: qty 1

## 2016-01-15 MED ORDER — CEPHALEXIN 500 MG PO CAPS
500.0000 mg | ORAL_CAPSULE | Freq: Two times a day (BID) | ORAL | 0 refills | Status: DC
Start: 2016-01-15 — End: 2016-04-11

## 2016-01-15 NOTE — ED Provider Notes (Signed)
[facility] Emergency Department Provider Note  ____________________________________________   First MD Initiated Contact with Patient 01/15/16 1958     (approximate)  I have reviewed the triage vital signs and the nursing notes.   HISTORY  Chief Complaint Altered Mental Status  The patient has chronic dementia which limits her ability to give a history  HPI Katie Hansen is a 82 y.o. female who is generally in very good physical health but does have significant dementia and lives at [facility].  She comes in today with her daughter and son-in-law for evaluation of aggressive behavior earlier today.  The daughter received a call from [facility] who stated that she had been aggressive with people at the facility.  The circumstances are unclear but apparently the symptoms are severe and acute in onset.  They told the daughter that she needs to be since the hospital and hospitalized in a psychiatry ward, but this is the first the daughter has known about this type of behavior.  The patient is currently calm and relaxed and has no memory of any incident that happened.  She denies any current medical symptoms as described below in the review of systems, although of course it is limited by her dementia and reflects only which she is currently feeling.  She is cared for by [facility].   Past Medical History:  Diagnosis Date  . Allergic rhinitis, cause unspecified   . Dementia   . Dyslipidemia    high HDL and LDL  . Hypertension     Patient Active Problem List   Diagnosis Date Noted  . Toenail fungus 08/13/2014  . Hyperglycemia 02/08/2014  . Osteoporosis, senile   . ALLERGIC RHINITIS 02/25/2010  . Dementia without behavioral disturbance 03/28/2009  . Essential hypertension 03/28/2009    Past Surgical History:  Procedure Laterality Date  . ABDOMINAL HYSTERECTOMY    . APPENDECTOMY      Prior to Admission medications   Medication  Sig Start Date End Date Taking? Authorizing Provider  acetaminophen (TYLENOL) 500 MG tablet Take 500 mg by mouth every 4 (four) hours as needed.   Yes Historical Provider, MD  ALPRAZolam (XANAX) 0.25 MG tablet Take 0.25 mg by mouth 3 (three) times daily as needed for anxiety. [id number]   Yes Historical Provider, MD  alum & mag hydroxide-simeth (MINTOX) 200-200-20 MG/5ML suspension Take 30 mLs by mouth every 6 (six) hours as needed for indigestion or heartburn.   Yes Historical Provider, MD  Cholecalciferol 1000 units tablet Take 2,000 Units by mouth daily.   Yes Historical Provider, MD  divalproex (DEPAKOTE SPRINKLE) 125 MG capsule Take by mouth 2 (two) times daily. 0800,2000   Yes Historical Provider, MD  donepezil (ARICEPT) 10 MG tablet Take 10 mg by mouth at bedtime.   Yes Historical Provider, MD  fexofenadine (ALLEGRA) 180 MG tablet Take 180 mg by mouth daily.   Yes Historical Provider, MD  guaifenesin (ROBITUSSIN) 100 MG/5ML syrup Take 200 mg by mouth every 6 (six) hours as needed for cough.   Yes Historical Provider, MD  loperamide (IMODIUM) 2 MG capsule Take 2 mg by mouth as needed for diarrhea or loose stools.   Yes Historical Provider, MD  magnesium hydroxide (MILK OF MAGNESIA) 400 MG/5ML suspension Take 30 mLs by mouth daily as needed for mild constipation.   Yes Historical Provider, MD  Melatonin 5 MG TABS Take by mouth.   Yes Historical Provider, MD  Neomycin-Bacitracin-Polymyxin (TRIPLE ANTIBIOTIC) [phone] OINT Apply 1  application topically as needed.   Yes Historical Provider, MD  sertraline (ZOLOFT) 50 MG tablet Take 50 mg by mouth daily.   Yes Historical Provider, MD  cephALEXin (KEFLEX) 500 MG capsule Take 1 capsule (500 mg total) by mouth 2 (two) times daily. 01/15/16   Loleta Roseory Siddhant Hashemi, MD    Allergies Review of patient's allergies indicates no known allergies.  Family History  Problem Relation Age of Onset  . Colon cancer Mother 2563    Social History Social History    Substance Use Topics  . Smoking status: Never Smoker  . Smokeless tobacco: Never Used     Comment: Widowed for many years-spouse died in plane crash. 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 moved to [place] indep living [facility]) in 08/2008 to be closer to dtr-lives alone  . Alcohol use No    Review of Systems Constitutional: No fever/chills Eyes: No visual changes. ENT: No sore throat. Cardiovascular: Denies chest pain. Respiratory: Denies shortness of breath. Gastrointestinal: No abdominal pain.  No nausea, no vomiting.  No diarrhea.  No constipation. Genitourinary: Negative for dysuria. Musculoskeletal: Negative for back pain. Skin: Negative for rash. Neurological: Negative for headaches, focal weakness or numbness.  10-point ROS otherwise negative.  ____________________________________________   PHYSICAL EXAM:  VITAL SIGNS: ED Triage Vitals  Enc Vitals Group     BP 01/15/16 1947 (!) 130/56     Pulse Rate 01/15/16 1943 64     Resp 01/15/16 1943 18     Temp 01/15/16 1943 98.2 F (36.8 C)     Temp Source 01/15/16 1943 Oral     SpO2 01/15/16 1943 97 %     Weight 01/15/16 1946 165 lb (74.8 kg)     Height 01/15/16 1946 5\' 5"  (1.651 m)     Head Circumference --      Peak Flow --      Pain Score --      Pain Loc --      Pain Edu? --      Excl. in GC? --     Constitutional: Alert and oriented. Well appearing and in no acute distress. Eyes: Conjunctivae are normal. PERRL. EOMI. Head: Atraumatic. Nose: No congestion/rhinnorhea. Mouth/Throat: Mucous membranes are moist.  Oropharynx non-erythematous. Neck: No stridor.  No meningeal signs.   Cardiovascular: Normal rate, regular rhythm. Good peripheral circulation. Grossly normal heart sounds. Respiratory: Normal respiratory effort.  No retractions. Lungs CTAB. Gastrointestinal: Soft and nontender. No distention.  Musculoskeletal: No lower extremity  tenderness nor edema. No gross deformities of extremities. Neurologic:  Normal speech and language. No gross focal neurologic deficits are appreciated.  Skin:  Skin is warm, dry and intact. No rash noted. Psychiatric: Mood and affect are normal. Speech and behavior are normal.  Confabulates to cover her lapses in memory.  ____________________________________________   LABS (all labs ordered are listed, but only abnormal results are displayed)  Labs Reviewed  URINALYSIS COMPLETEWITH MICROSCOPIC ([facility] ONLY) - Abnormal; Notable for the following:       Result Value   Color, Urine YELLOW (*)    APPearance CLEAR (*)    Leukocytes, UA 3+ (*)    Squamous Epithelial / LPF 0-5 (*)    All other components within normal limits  URINE CULTURE   ____________________________________________  EKG  None - EKG not ordered by ED physician ____________________________________________  RADIOLOGY   No results found.  ____________________________________________   PROCEDURES  Procedure(s) performed:  Procedures   Critical Care performed: No ____________________________________________   INITIAL IMPRESSION / ASSESSMENT AND PLAN / ED COURSE  Pertinent labs & imaging results that were available during my care of the patient were reviewed by me and considered in my medical decision making (see chart for details).  I had a long discussion with her daughter.  The patient had a recent urinary tract infection which seem to change her behavior somewhat at the time so we will check a urinalysis.  I do not feel that there is any medical indication for checking blood work at this time obtaining any imaging as she has normal vital signs, normal physical exam, and is asymptomatic currently.  We had a long discussion about changing behavior over time in the setting of dementia and how eventually she may need geropsychiatric evaluation and placement, but that does not seem to be the case at this time.   We are currently reconciling her home medications I will see if there are any medications that I feel comfortable making currently, and then the patient and her daughter can follow up with [facility] as an outpatient.  I feel that keeping the patient confined in the emergency department for psychiatric evaluation is not only not indicated currently but would be detrimental to her and likely result in delirium on top of her dementia.  The daughter is in full agreement at this time.   Clinical Course  Value Comment By Time  Leukocytes, UA: (!) 3+ Urinalysis is positive.  I will treat empirically with Keflex.  I reiterated the follow-up recommendations with the patient's family and they understand and agree with the plan.  Urine culture was sent. Loleta Nakiya, MD 10/25 2103    ____________________________________________  FINAL CLINICAL IMPRESSION(S) / ED DIAGNOSES  Final diagnoses:  Altered mental status, unspecified altered mental status type  Urinary tract infection without hematuria, site unspecified     MEDICATIONS GIVEN DURING THIS VISIT:  Medications  cephALEXin (KEFLEX) capsule 500 mg (not administered)     NEW OUTPATIENT MEDICATIONS STARTED DURING THIS VISIT:  New Prescriptions   CEPHALEXIN (KEFLEX) 500 MG CAPSULE    Take 1 capsule (500 mg total) by mouth 2 (two) times daily.    Modified Medications   No medications on file    Discontinued Medications   CALCIUM CARBONATE (CALCIUM 500 PO)    Take 1 tablet by mouth 2 (two) times daily.     CHOLECALCIFEROL 1000 UNITS TABLET    Take 1,000 Units by mouth daily.     LORAZEPAM (ATIVAN) 0.5 MG TABLET    Take 1 tablet (0.5 mg total) by mouth 2 (two) times daily as needed for anxiety.   MULTIPLE VITAMIN (MULTIVITAMIN) TABLET    Take 1 tablet by mouth daily.       Note:  This document was prepared using Dragon voice recognition software and may include unintentional dictation errors.    Loleta Doralene,  MD 01/15/16 2104

## 2016-01-15 NOTE — Discharge Instructions (Signed)
As we discussed, Katie Hansen is alert and appropriate at this time.  Her urinalysis was positive for infection, and we have started her on antibiotics and sent a urine for culture to make sure that we chose an appropriate antibiotic.  Because she seems to be at her baseline and has an otherwise reassuring exam, we feel that follow up with her primary care providers ([facility]) is most appropriate after a trial of antibiotics rather than immediately moving forward with trying to place her in a geropsychiatric facility.  [facility] may be able to review her medications and offer some medication changes to help with her behavioral disturbances and progressive dementia in a way that we would not be able to do in the emergency department.  Please follow up with her primary care provider at the next available opportunity.

## 2016-01-15 NOTE — ED Triage Notes (Addendum)
Pt brought in by daughter from [facility]. Pt brought in for aggressive behavior today.  Pt calm in triage.  Alert.  Hx recent uti 2 weeks ago.

## 2016-01-17 LAB — URINE CULTURE: Special Requests: NORMAL

## 2016-02-24 ENCOUNTER — Emergency Department: Payer: Medicare Other

## 2016-02-24 ENCOUNTER — Emergency Department
Admission: EM | Admit: 2016-02-24 | Discharge: 2016-02-24 | Disposition: A | Discharge status: Home / Self Care | Payer: Medicare Other | Attending: Emergency Medicine | Admitting: Emergency Medicine

## 2016-02-24 ENCOUNTER — Encounter: Payer: Self-pay | Admitting: Emergency Medicine

## 2016-02-24 DIAGNOSIS — R079 Chest pain, unspecified: Secondary | ICD-10-CM | POA: Diagnosis not present

## 2016-02-24 DIAGNOSIS — R071 Chest pain on breathing: Secondary | ICD-10-CM | POA: Diagnosis present

## 2016-02-24 DIAGNOSIS — Z79899 Other long term (current) drug therapy: Secondary | ICD-10-CM | POA: Diagnosis not present

## 2016-02-24 DIAGNOSIS — I10 Essential (primary) hypertension: Secondary | ICD-10-CM | POA: Insufficient documentation

## 2016-02-24 LAB — COMPREHENSIVE METABOLIC PANEL
ALK PHOS: 63 U/L (ref 38–126)
ALT: 11 U/L — ABNORMAL LOW (ref 14–54)
ANION GAP: 7 (ref 5–15)
AST: 12 U/L — ABNORMAL LOW (ref 15–41)
Albumin: 3.4 g/dL — ABNORMAL LOW (ref 3.5–5.0)
BILIRUBIN TOTAL: 0.4 mg/dL (ref 0.3–1.2)
BUN: 20 mg/dL (ref 6–20)
CO2: 32 mmol/L (ref 22–32)
Calcium: 9.1 mg/dL (ref 8.9–10.3)
Chloride: 100 mmol/L — ABNORMAL LOW (ref 101–111)
Creatinine, Ser: 0.68 mg/dL (ref 0.44–1.00)
Glucose, Bld: 107 mg/dL — ABNORMAL HIGH (ref 65–99)
POTASSIUM: 4.2 mmol/L (ref 3.5–5.1)
Sodium: 139 mmol/L (ref 135–145)
TOTAL PROTEIN: 6.5 g/dL (ref 6.5–8.1)

## 2016-02-24 LAB — CBC WITH DIFFERENTIAL/PLATELET
Basophils Absolute: 0 10*3/uL (ref 0–0.1)
Basophils Relative: 0 %
Eosinophils Absolute: 0.1 10*3/uL (ref 0–0.7)
Eosinophils Relative: 1 %
HEMATOCRIT: 39.3 % (ref 35.0–47.0)
HEMOGLOBIN: 13 g/dL (ref 12.0–16.0)
LYMPHS ABS: 1.4 10*3/uL (ref 1.0–3.6)
Lymphocytes Relative: 13 %
MCH: 28.3 pg (ref 26.0–34.0)
MCHC: 33.1 g/dL (ref 32.0–36.0)
MCV: 85.6 fL (ref 80.0–100.0)
Monocytes Absolute: 1.1 10*3/uL — ABNORMAL HIGH (ref 0.2–0.9)
Monocytes Relative: 11 %
NEUTROS ABS: 8.1 10*3/uL — AB (ref 1.4–6.5)
NEUTROS PCT: 75 %
Platelets: 268 10*3/uL (ref 150–440)
RBC: 4.59 MIL/uL (ref 3.80–5.20)
RDW: 13.8 % (ref 11.5–14.5)
WBC: 10.8 10*3/uL (ref 3.6–11.0)

## 2016-02-24 LAB — TROPONIN I

## 2016-02-24 MED ORDER — TRAMADOL HCL 50 MG PO TABS
50.0000 mg | ORAL_TABLET | Freq: Once | ORAL | Status: AC
  Administered 2016-02-24: 50 mg via ORAL

## 2016-02-24 MED ORDER — IOPAMIDOL (ISOVUE-370) INJECTION 76%
75.0000 mL | Freq: Once | INTRAVENOUS | Status: AC | PRN
  Administered 2016-02-24: 75 mL via INTRAVENOUS

## 2016-02-24 MED ORDER — TRAMADOL HCL 50 MG PO TABS: 50.0000 mg | ORAL_TABLET | Freq: Four times a day (QID) | ORAL | 0 refills | Status: DC | PRN

## 2016-02-24 MED ORDER — TRAMADOL HCL 50 MG PO TABS
ORAL_TABLET | ORAL | Status: AC
  Administered 2016-02-24: 50 mg via ORAL
  Filled 2016-02-24: qty 1

## 2016-02-24 NOTE — ED Provider Notes (Signed)
[facility] Emergency Department Provider Note  Time seen: 6:04 PM  I have reviewed the triage vital signs and the nursing notes.   HISTORY  Chief Complaint Chest Pain    HPI Katie Hansen is a 82 y.o. female with a past medical history of dementia, hypertension, hyperlipidemia who presents to the emergency department with chest pain. According to the patient and her daughter the patient was complaining of left-sided chest pain since this morning. States the patient will occasionally appear to be experiencing significant pain in the left chest and left upper back. They thought that it could be a pulled muscle however as it did not improve throughout the day and they brought her to the emergency department for further evaluation. Currently the patient denies any anterior pain but continues to state pain around the left shoulder blade. Somewhat worse with movement or deep inspiration. Occasional mild cough. Denies any leg pain or swelling. Much of the history is provided by the daughter as the patient has dementia.  Past Medical History:  Diagnosis Date  . Allergic rhinitis, cause unspecified   . Dementia   . Dyslipidemia    high HDL and LDL  . Hypertension     Patient Active Problem List   Diagnosis Date Noted  . Toenail fungus 08/13/2014  . Hyperglycemia 02/08/2014  . Osteoporosis, senile   . ALLERGIC RHINITIS 02/25/2010  . Dementia without behavioral disturbance 03/28/2009  . Essential hypertension 03/28/2009    Past Surgical History:  Procedure Laterality Date  . ABDOMINAL HYSTERECTOMY    . APPENDECTOMY      Prior to Admission medications   Medication Sig Start Date End Date Taking? Authorizing Provider  acetaminophen (TYLENOL) 500 MG tablet Take 500 mg by mouth every 4 (four) hours as needed.    Historical Provider, MD  ALPRAZolam Prudy Feeler(XANAX) 0.25 MG tablet Take 0.25 mg by mouth 3 (three) times daily as needed for anxiety. [id number],1400,2000     Historical Provider, MD  alum & mag hydroxide-simeth (MINTOX) 200-200-20 MG/5ML suspension Take 30 mLs by mouth every 6 (six) hours as needed for indigestion or heartburn.    Historical Provider, MD  cephALEXin (KEFLEX) 500 MG capsule Take 1 capsule (500 mg total) by mouth 2 (two) times daily. 01/15/16   Loleta Roseory Forbach, MD  Cholecalciferol 1000 units tablet Take 2,000 Units by mouth daily.    Historical Provider, MD  divalproex (DEPAKOTE SPRINKLE) 125 MG capsule Take by mouth 2 (two) times daily. [id number],2000    Historical Provider, MD  donepezil (ARICEPT) 10 MG tablet Take 10 mg by mouth at bedtime.    Historical Provider, MD  fexofenadine (ALLEGRA) 180 MG tablet Take 180 mg by mouth daily.    Historical Provider, MD  guaifenesin (ROBITUSSIN) 100 MG/5ML syrup Take 200 mg by mouth every 6 (six) hours as needed for cough.    Historical Provider, MD  loperamide (IMODIUM) 2 MG capsule Take 2 mg by mouth as needed for diarrhea or loose stools.    Historical Provider, MD  magnesium hydroxide (MILK OF MAGNESIA) 400 MG/5ML suspension Take 30 mLs by mouth daily as needed for mild constipation.    Historical Provider, MD  Melatonin 5 MG TABS Take by mouth.    Historical Provider, MD  Neomycin-Bacitracin-Polymyxin (TRIPLE ANTIBIOTIC) [phone] OINT Apply 1 application topically as needed.    Historical Provider, MD  sertraline (ZOLOFT) 50 MG tablet Take 50 mg by mouth daily.    Historical Provider, MD    No Known Allergies  Family History  Problem Relation Age of Onset  . Colon cancer Mother 8263    Social History Social History  Substance Use Topics  . Smoking status: Never Smoker  . Smokeless tobacco: Never Used     Comment: Widowed for many years-spouse died in plane crash. 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 moved to [place] ind[place] living [facility] [place]) in 08/2008 to be closer to dtr-lives alone  . Alcohol use No    Review of  Systems Constitutional: Negative for fever. Cardiovascular: Left-sided chest pain. Left-sided upper back pain. Respiratory: Negative for shortness of breath. Gastrointestinal: Negative for abdominal pain Musculoskeletal: Left upper back pain Skin: Negative for rash. Neurological: Negative for headache 10-point ROS otherwise negative.  ____________________________________________   PHYSICAL EXAM:  VITAL SIGNS: ED Triage Vitals  Enc Vitals Group     BP 02/24/16 1628 (!) 147/52     Pulse Rate 02/24/16 1628 69     Resp 02/24/16 1628 18     Temp 02/24/16 1628 98.1 F (36.7 C)     Temp Source 02/24/16 1628 Oral     SpO2 02/24/16 1628 98 %     Weight 02/24/16 1626 180 lb (81.6 kg)     Height 02/24/16 1626 5\' 4"  (1.626 m)     Head Circumference --      Peak Flow --      Pain Score 02/24/16 1626 6     Pain Loc --      Pain Edu? --      Excl. in GC? --     Constitutional: Alert and oriented. Well appearing and in no distress. Eyes: Normal exam ENT   Head: Normocephalic and atraumatic.   Mouth/Throat: Mucous membranes are moist. Cardiovascular: Normal rate, regular rhythm.  Respiratory: Normal respiratory effort without tachypnea nor retractions. Breath sounds are clear Gastrointestinal: Soft and nontender. No distention.   Musculoskeletal: Nontender with normal range of motion in all extremities. No lower extremity tenderness or edema.Mild left-sided chest tenderness to palpation anteriorly and posteriorly. Neurologic:  Normal speech and language. No gross focal neurologic deficits  Skin:  Skin is warm, dry and intact.  Psychiatric: Mood and affect are normal  ____________________________________________    EKG  EKG reviewed and interpreted by myself shows normal sinus rhythm at 71 bpm, narrow QRS, normal axis, normal intervals, no concerning ST changes.  ____________________________________________    RADIOLOGY  Chest x-ray  negative  ____________________________________________   INITIAL IMPRESSION / ASSESSMENT AND PLAN / ED COURSE  Pertinent labs & imaging results that were available during my care of the patient were reviewed by me and considered in my medical decision making (see chart for details).  Patient with history of dementia presents the emergency department with left-sided chest pain/left upper back pain since this morning. Patient's workup including troponin is negative/normal, chest x-ray is normal. EKG is reassuring. Given the patient's left-sided chest pain, somewhat pleuritic in nature we'll proceed with a CT angiography of the chest rule out PE. If CT is negative I believe the patient can be safely discharged home with PCP follow-up. Family agreeable.  CT scan is negative. Highly suspect muscle skeletal pain. We will discharge home with PCP follow-up.  ____________________________________________   FINAL CLINICAL IMPRESSION(S) / ED DIAGNOSES  Chest pain    Minna AntisKevin Ajani Schnieders, MD 02/24/16 1935

## 2016-02-24 NOTE — ED Triage Notes (Signed)
C/o pain in left shoulder and chest, staff at [facility] states she has been complaining all day.  Pt has dementia and does not recall an injury. No obnious injuries noted, +ROM

## 2016-04-10 ENCOUNTER — Emergency Department: Payer: Medicare Other

## 2016-04-10 ENCOUNTER — Observation Stay
Admission: EM | Admit: 2016-04-10 | Discharge: 2016-04-11 | Disposition: A | Discharge status: Home / Self Care | Payer: Medicare Other | Attending: Internal Medicine | Admitting: Internal Medicine

## 2016-04-10 ENCOUNTER — Encounter: Payer: Self-pay | Admitting: Emergency Medicine

## 2016-04-10 DIAGNOSIS — G459 Transient cerebral ischemic attack, unspecified: Secondary | ICD-10-CM

## 2016-04-10 DIAGNOSIS — F329 Major depressive disorder, single episode, unspecified: Secondary | ICD-10-CM | POA: Diagnosis not present

## 2016-04-10 DIAGNOSIS — R12 Heartburn: Secondary | ICD-10-CM | POA: Diagnosis not present

## 2016-04-10 DIAGNOSIS — F419 Anxiety disorder, unspecified: Secondary | ICD-10-CM | POA: Diagnosis not present

## 2016-04-10 DIAGNOSIS — Z90710 Acquired absence of both cervix and uterus: Secondary | ICD-10-CM | POA: Insufficient documentation

## 2016-04-10 DIAGNOSIS — I10 Essential (primary) hypertension: Secondary | ICD-10-CM | POA: Insufficient documentation

## 2016-04-10 DIAGNOSIS — J209 Acute bronchitis, unspecified: Secondary | ICD-10-CM | POA: Diagnosis not present

## 2016-04-10 DIAGNOSIS — G3189 Other specified degenerative diseases of nervous system: Secondary | ICD-10-CM | POA: Insufficient documentation

## 2016-04-10 DIAGNOSIS — F0151 Vascular dementia with behavioral disturbance: Secondary | ICD-10-CM | POA: Insufficient documentation

## 2016-04-10 DIAGNOSIS — R4781 Slurred speech: Secondary | ICD-10-CM | POA: Diagnosis not present

## 2016-04-10 DIAGNOSIS — I6523 Occlusion and stenosis of bilateral carotid arteries: Secondary | ICD-10-CM | POA: Diagnosis not present

## 2016-04-10 DIAGNOSIS — E785 Hyperlipidemia, unspecified: Secondary | ICD-10-CM | POA: Insufficient documentation

## 2016-04-10 DIAGNOSIS — G9340 Encephalopathy, unspecified: Principal | ICD-10-CM | POA: Insufficient documentation

## 2016-04-10 DIAGNOSIS — I639 Cerebral infarction, unspecified: Secondary | ICD-10-CM | POA: Diagnosis present

## 2016-04-10 LAB — COMPREHENSIVE METABOLIC PANEL
ALBUMIN: 3.4 g/dL — AB (ref 3.5–5.0)
ALT: 16 U/L (ref 14–54)
ANION GAP: 6 (ref 5–15)
AST: 17 U/L (ref 15–41)
Alkaline Phosphatase: 47 U/L (ref 38–126)
BUN: 21 mg/dL — ABNORMAL HIGH (ref 6–20)
CHLORIDE: 100 mmol/L — AB (ref 101–111)
CO2: 33 mmol/L — AB (ref 22–32)
Calcium: 8.9 mg/dL (ref 8.9–10.3)
Creatinine, Ser: 0.66 mg/dL (ref 0.44–1.00)
GFR calc non Af Amer: >60 mL/min (ref >60)
GLUCOSE: 96 mg/dL (ref 65–99)
Potassium: 4.2 mmol/L (ref 3.5–5.1)
SODIUM: 139 mmol/L (ref 135–145)
Total Bilirubin: 0.4 mg/dL (ref 0.3–1.2)
Total Protein: 6.5 g/dL (ref 6.5–8.1)

## 2016-04-10 LAB — CBC
HCT: 39.1 % (ref 35.0–47.0)
HEMOGLOBIN: 13 g/dL (ref 12.0–16.0)
MCH: 27.8 pg (ref 26.0–34.0)
MCHC: 33.3 g/dL (ref 32.0–36.0)
MCV: 83.5 fL (ref 80.0–100.0)
Platelets: 298 10*3/uL (ref 150–440)
RBC: 4.69 MIL/uL (ref 3.80–5.20)
RDW: 14.3 % (ref 11.5–14.5)
WBC: 6.6 10*3/uL (ref 3.6–11.0)

## 2016-04-10 LAB — URINALYSIS, COMPLETE (UACMP) WITH MICROSCOPIC
BILIRUBIN URINE: NEGATIVE
Glucose, UA: NEGATIVE mg/dL
Hgb urine dipstick: NEGATIVE
KETONES UR: 5 mg/dL — AB
NITRITE: NEGATIVE
Protein, ur: NEGATIVE mg/dL
RBC / HPF: NONE SEEN RBC/hpf (ref 0–5)
Specific Gravity, Urine: 1.009 (ref 1.005–1.030)
Squamous Epithelial / LPF: NONE SEEN
pH: 7 (ref 5.0–8.0)

## 2016-04-10 LAB — INFLUENZA PANEL BY PCR (TYPE A & B)
Influenza A By PCR: NEGATIVE
Influenza B By PCR: NEGATIVE

## 2016-04-10 LAB — TROPONIN I

## 2016-04-10 MED ORDER — ONDANSETRON HCL 4 MG/2ML IJ SOLN: 4.0000 mg | Freq: Four times a day (QID) | INTRAMUSCULAR | Status: DC | PRN

## 2016-04-10 MED ORDER — LORATADINE 10 MG PO TABS
10.0000 mg | ORAL_TABLET | Freq: Every day | ORAL | Status: DC
  Administered 2016-04-11: 10 mg via ORAL
  Filled 2016-04-10 (×2): qty 1

## 2016-04-10 MED ORDER — ASPIRIN 81 MG PO CHEW
324.0000 mg | CHEWABLE_TABLET | Freq: Once | ORAL | Status: AC
  Administered 2016-04-10: 324 mg via ORAL
  Filled 2016-04-10: qty 4

## 2016-04-10 MED ORDER — CLONAZEPAM 0.5 MG PO TABS
0.5000 mg | ORAL_TABLET | Freq: Two times a day (BID) | ORAL | Status: DC
  Administered 2016-04-10 – 2016-04-11 (×2): 0.5 mg via ORAL
  Filled 2016-04-10 (×2): qty 1

## 2016-04-10 MED ORDER — QUETIAPINE FUMARATE 25 MG PO TABS
50.0000 mg | ORAL_TABLET | Freq: Two times a day (BID) | ORAL | Status: DC
  Administered 2016-04-10 – 2016-04-11 (×2): 50 mg via ORAL
  Filled 2016-04-10 (×2): qty 2

## 2016-04-10 MED ORDER — DONEPEZIL HCL 5 MG PO TABS
10.0000 mg | ORAL_TABLET | Freq: Every day | ORAL | Status: DC
  Administered 2016-04-10: 10 mg via ORAL
  Filled 2016-04-10: qty 2

## 2016-04-10 MED ORDER — DIVALPROEX SODIUM 125 MG PO CSDR
125.0000 mg | DELAYED_RELEASE_CAPSULE | Freq: Three times a day (TID) | ORAL | Status: DC
  Administered 2016-04-10 – 2016-04-11 (×2): 125 mg via ORAL
  Filled 2016-04-10 (×2): qty 1

## 2016-04-10 MED ORDER — LOPERAMIDE HCL 2 MG PO CAPS
2.0000 mg | ORAL_CAPSULE | ORAL | Status: DC | PRN
Start: 2016-04-10 — End: 2016-04-11

## 2016-04-10 MED ORDER — ALUM & MAG HYDROXIDE-SIMETH 200-200-20 MG/5ML PO SUSP: 30.0000 mL | Freq: Four times a day (QID) | ORAL | Status: DC | PRN

## 2016-04-10 MED ORDER — SODIUM CHLORIDE 0.9 % IV SOLN
Freq: Once | INTRAVENOUS | Status: AC
  Administered 2016-04-10: 1000 mL via INTRAVENOUS

## 2016-04-10 MED ORDER — OSELTAMIVIR PHOSPHATE 75 MG PO CAPS
75.0000 mg | ORAL_CAPSULE | Freq: Two times a day (BID) | ORAL | Status: DC
  Administered 2016-04-10 – 2016-04-11 (×2): 75 mg via ORAL
  Filled 2016-04-10 (×2): qty 1

## 2016-04-10 MED ORDER — SERTRALINE HCL 50 MG PO TABS
50.0000 mg | ORAL_TABLET | Freq: Every day | ORAL | Status: DC
  Administered 2016-04-11: 10:00:00 50 mg via ORAL
  Filled 2016-04-10 (×2): qty 1

## 2016-04-10 MED ORDER — ALPRAZOLAM 0.25 MG PO TABS: 0.2500 mg | ORAL_TABLET | Freq: Three times a day (TID) | ORAL | Status: DC | PRN

## 2016-04-10 MED ORDER — ACETAMINOPHEN 650 MG RE SUPP: 650.0000 mg | Freq: Four times a day (QID) | RECTAL | Status: DC | PRN

## 2016-04-10 MED ORDER — VITAMIN D 1000 UNITS PO TABS
2000.0000 [IU] | ORAL_TABLET | Freq: Every day | ORAL | Status: DC
  Administered 2016-04-11: 2000 [IU] via ORAL
  Filled 2016-04-10 (×2): qty 2

## 2016-04-10 MED ORDER — GUAIFENESIN 100 MG/5ML PO SYRP
200.0000 mg | ORAL_SOLUTION | Freq: Four times a day (QID) | ORAL | Status: DC | PRN
  Filled 2016-04-10: qty 10

## 2016-04-10 MED ORDER — ENOXAPARIN SODIUM 40 MG/0.4ML ~~LOC~~ SOLN
40.0000 mg | SUBCUTANEOUS | Status: DC
  Administered 2016-04-10: 23:00:00 40 mg via SUBCUTANEOUS
  Filled 2016-04-10: qty 0.4

## 2016-04-10 MED ORDER — ASPIRIN EC 81 MG PO TBEC
81.0000 mg | DELAYED_RELEASE_TABLET | Freq: Every day | ORAL | Status: DC
  Administered 2016-04-11: 10:00:00 81 mg via ORAL
  Filled 2016-04-10: qty 1

## 2016-04-10 MED ORDER — ACETAMINOPHEN 325 MG PO TABS: 650.0000 mg | ORAL_TABLET | Freq: Four times a day (QID) | ORAL | Status: DC | PRN

## 2016-04-10 MED ORDER — MAGNESIUM HYDROXIDE 400 MG/5ML PO SUSP: 30.0000 mL | Freq: Every day | ORAL | Status: DC | PRN

## 2016-04-10 MED ORDER — ONDANSETRON HCL 4 MG PO TABS: 4.0000 mg | ORAL_TABLET | Freq: Four times a day (QID) | ORAL | Status: DC | PRN

## 2016-04-10 MED ORDER — GABAPENTIN 100 MG PO CAPS
100.0000 mg | ORAL_CAPSULE | Freq: Two times a day (BID) | ORAL | Status: DC
  Administered 2016-04-10 – 2016-04-11 (×2): 100 mg via ORAL
  Filled 2016-04-10 (×2): qty 1

## 2016-04-10 NOTE — ED Provider Notes (Signed)
[facility] Emergency Department Provider Note   ____________________________________________   First MD Initiated Contact with Patient 04/10/16 1829     (approximate)  I have reviewed the triage vital signs and the nursing notes.   HISTORY  Chief Complaint Altered Mental Status    HPI Katie Hansen is a 83 y.o. female who is sent from [place] house for her symptoms are similar to what her roommate was exhibiting when she had the flu. She apparently is slightly altered. Uncertain if patient has a cough cold or any other symptoms. Patient was brought back from the lobby more rapidly because she had 2 episodes of very slurry speech and was briefly noticed by the nurse to have right side of her face not being moving. Symptoms all resolved when she got back to the emergency room. \ Past Medical History:  Diagnosis Date  . Allergic rhinitis, cause unspecified   . Dementia   . Dyslipidemia    high HDL and LDL  . Hypertension     Patient Active Problem List   Diagnosis Date Noted  . Toenail fungus 08/13/2014  . Hyperglycemia 02/08/2014  . Osteoporosis, senile   . ALLERGIC RHINITIS 02/25/2010  . Dementia without behavioral disturbance 03/28/2009  . Essential hypertension 03/28/2009    Past Surgical History:  Procedure Laterality Date  . ABDOMINAL HYSTERECTOMY    . APPENDECTOMY      Prior to Admission medications   Medication Sig Start Date End Date Taking? Authorizing Provider  acetaminophen (TYLENOL) 500 MG tablet Take 500 mg by mouth every 4 (four) hours as needed.    Historical Provider, MD  ALPRAZolam Prudy Feeler(XANAX) 0.25 MG tablet Take 0.25 mg by mouth 3 (three) times daily as needed for anxiety. 1610,[id number],2000    Historical Provider, MD  alum & mag hydroxide-simeth (MINTOX) 200-200-20 MG/5ML suspension Take 30 mLs by mouth every 6 (six) hours as needed for indigestion or heartburn.    Historical Provider, MD  cephALEXin (KEFLEX) 500 MG capsule Take  1 capsule (500 mg total) by mouth 2 (two) times daily. 01/15/16   Loleta Roseory Forbach, MD  Cholecalciferol 1000 units tablet Take 2,000 Units by mouth daily.    Historical Provider, MD  divalproex (DEPAKOTE SPRINKLE) 125 MG capsule Take by mouth 2 (two) times daily. 8119,[id number],2000    Historical Provider, MD  donepezil (ARICEPT) 10 MG tablet Take 10 mg by mouth at bedtime.    Historical Provider, MD  fexofenadine (ALLEGRA) 180 MG tablet Take 180 mg by mouth daily.    Historical Provider, MD  guaifenesin (ROBITUSSIN) 100 MG/5ML syrup Take 200 mg by mouth every 6 (six) hours as needed for cough.    Historical Provider, MD  loperamide (IMODIUM) 2 MG capsule Take 2 mg by mouth as needed for diarrhea or loose stools.    Historical Provider, MD  magnesium hydroxide (MILK OF MAGNESIA) 400 MG/5ML suspension Take 30 mLs by mouth daily as needed for mild constipation.    Historical Provider, MD  Melatonin 5 MG TABS Take by mouth.    Historical Provider, MD  Neomycin-Bacitracin-Polymyxin (TRIPLE ANTIBIOTIC) [phone] OINT Apply 1 application topically as needed.    Historical Provider, MD  sertraline (ZOLOFT) 50 MG tablet Take 50 mg by mouth daily.    Historical Provider, MD  traMADol (ULTRAM) 50 MG tablet Take 1 tablet (50 mg total) by mouth every 6 (six) hours as needed. 02/24/16 02/23/17  Minna AntisKevin Paduchowski, MD    Allergies Patient has no known allergies.  Family History  Problem Relation Age of Onset  . Colon cancer Mother 3    Social History Social History  Substance Use Topics  . Smoking status: Never Smoker  . Smokeless tobacco: Never Used     Comment: Widowed for many years-spouse died in plane crash. 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 moved to [place] indep living [place]) in 08/2008 to be closer to dtr-lives alone  . Alcohol use No    Review of Systems Constitutional: No fever/chills Eyes: No visual changes. ENT: No sore  throat. Cardiovascular: Denies chest pain. Respiratory: Denies shortness of breath. Gastrointestinal: No abdominal pain.  No nausea, no vomiting.  No diarrhea.  No constipation. Genitourinary: Negative for dysuria. Musculoskeletal: Negative for back pain. Skin: Negative for rash. Neurological: Negative for headaches, focal weakness or numbness.  10-point ROS otherwise negative.  ____________________________________________   PHYSICAL EXAM:  VITAL SIGNS: ED Triage Vitals  Enc Vitals Group     BP 04/10/16 1600 120/70     Pulse Rate 04/10/16 1600 64     Resp 04/10/16 1600 14     Temp 04/10/16 1600 98.2 F (36.8 C)     Temp Source 04/10/16 1600 Oral     SpO2 04/10/16 1600 94 %     Weight 04/10/16 1609 180 lb (81.6 kg)     Height 04/10/16 1601 5\' 4"  (1.626 m)     Head Circumference --      Peak Flow --      Pain Score --      Pain Loc --      Pain Edu? --      Excl. in GC? --     Constitutional: Alert and orientedTo person and hospital. Well appearing and in no acute distress. Eyes: Conjunctivae are normal. PERRL. EOMI. Head: Atraumatic. Nose: No congestion/rhinnorhea. Mouth/Throat: Mucous membranes are moist.  Oropharynx non-erythematous. Neck: No stridor.   Cardiovascular: Normal rate, regular rhythm. Grossly normal heart sounds.  Good peripheral circulation. Respiratory: Normal respiratory effort.  No retractions. Lungs CTAB. Gastrointestinal: Soft and nontender. No distention. No abdominal bruits. No CVA tenderness. Musculoskeletal: No lower extremity tenderness nor edema.  No joint effusions. Neurologic:  Normal speech and language. No gross focal neurologic deficits are appreciated. Cranial nerves II through XII are intact cerebellar finger-nose rapid alternating movements and hands are normal motor strength is 5 over 5 throughout sensation appears to be intact. Skin:  Skin is warm, dry and intact. No rash noted. Psychiatric: Mood and affect are normal. Speech and  behavior are normal.  ____________________________________________   LABS (all labs ordered are listed, but only abnormal results are displayed)  Labs Reviewed  COMPREHENSIVE METABOLIC PANEL - Abnormal; Notable for the following:       Result Value   Chloride 100 (*)    CO2 33 (*)    BUN 21 (*)    Albumin 3.4 (*)    All other components within normal limits  CBC  INFLUENZA PANEL BY PCR (TYPE A & B)  URINALYSIS, COMPLETE (UACMP) WITH MICROSCOPIC  TROPONIN I  VALPROIC ACID LEVEL  CBG MONITORING, ED   ____________________________________________  EKG  EKG read and is turbid by me shows normal sinus rhythm rate of 66 patient has right axis nonspecific ST-T wave changes ____________________________________________  RADIOLOGY  Study Result   CLINICAL DATA:  Patient is a resident at [facility] in the Memory Care unit. Daughter was called today by staff and said that patient  more confused today and not steady on feet. Patient's roommate was diagnosed with flu last week.  EXAM: CT HEAD WITHOUT CONTRAST  TECHNIQUE: Contiguous axial images were obtained from the base of the skull through the vertex without intravenous contrast.  COMPARISON:  03/04/2009  FINDINGS: Brain: No evidence of acute infarction, hemorrhage, extra-axial collection, ventriculomegaly, or mass effect. Generalized cerebral atrophy. Periventricular white matter low attenuation likely secondary to microangiopathy.  Vascular: Cerebrovascular atherosclerotic calcifications are noted.  Skull: Negative for fracture or focal lesion.  Sinuses/Orbits: Visualized portions of the orbits are unremarkable. Visualized portions of the paranasal sinuses and mastoid air cells are unremarkable.  Other: None.  IMPRESSION: 1. No acute intracranial pathology. 2. Chronic microvascular disease and cerebral atrophy.   Electronically Signed   By: Elige Ko   On: 04/10/2016 18:51   Study Result     CLINICAL DATA:  LEFT-sided chest pain and back pain  EXAM: PORTABLE CHEST 1 VIEW  COMPARISON:  02/24/2016  FINDINGS: Normal cardiac silhouette. There is coarsened central bronchovascular markings. Low lung volumes. No focal infiltrate. No pneumothorax.  IMPRESSION: Findings suggest bronchitis.   Electronically Signed   By: Genevive Bi M.D.    ____________________________________________   PROCEDURES  Procedure(s) performed:  Procedures  Critical Care performed:  ____________________________________________   INITIAL IMPRESSION / ASSESSMENT AND PLAN / ED COURSE  Pertinent labs & imaging results that were available during my care of the patient were reviewed by me and considered in my medical decision making (see chart for details).        ____________________________________________   FINAL CLINICAL IMPRESSION(S) / ED DIAGNOSES  Final diagnoses:  Transient cerebral ischemia, unspecified type      NEW MEDICATIONS STARTED DURING THIS VISIT:  New Prescriptions   No medications on file     Note:  This document was prepared using Dragon voice recognition software and may include unintentional dictation errors.    Arnaldo Natal, MD 04/10/16 [phone]

## 2016-04-10 NOTE — H&P (Signed)
[facility] - [place] at [facility]   PATIENT NAME: Katie Hansen    MR#:  [id number]  DATE OF BIRTH:  10-24-1927  DATE OF ADMISSION:  04/10/2016  PRIMARY CARE PHYSICIAN: Rene Paci, MD   REQUESTING/REFERRING PHYSICIAN: Dr. Dorothea Glassman  CHIEF COMPLAINT:   Chief Complaint  Patient presents with  . Altered Mental Status    HISTORY OF PRESENT ILLNESS:  Katie Hansen  is a 83 y.o. female with a known history of Vascular dementia, hypertension, hyperlipidemia, allergic rhinitis who presents to the hospital from assisted living due to altered mental status and slurred speech. Patient herself is not up to stand for most history obtained from the daughter at bedside. As per the daughter the facility called her today saying that her mother is not acting like herself and is more confused and lethargic today. She was brought to the ER for further evaluation and while in the emergency room patient had 2 episodes of slurred speech and also was lethargic and confused which was not her baseline. Since then she has improved in the emergency room. She denied any headache, numbness tingling or any focal weakness of her extremities. Hospitalist services were contacted for further evaluation given her transient neurologic symptoms.  PAST MEDICAL HISTORY:   Past Medical History:  Diagnosis Date  . Allergic rhinitis, cause unspecified   . Dementia   . Dyslipidemia    high HDL and LDL  . Hypertension     PAST SURGICAL HISTORY:   Past Surgical History:  Procedure Laterality Date  . ABDOMINAL HYSTERECTOMY    . APPENDECTOMY      SOCIAL HISTORY:   Social History  Substance Use Topics  . Smoking status: Never Smoker  . Smokeless tobacco: Never Used     Comment: Widowed for many years-spouse died in plane crash. 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 moved to [place] indep living [place]) in 08/2008 to be closer to  dtr-lives alone  . Alcohol use No    FAMILY HISTORY:   Family History  Problem Relation Age of Onset  . Colon cancer Mother 43  . Tuberculosis Father     DRUG ALLERGIES:  No Known Allergies  REVIEW OF SYSTEMS:   Review of Systems  Unable to perform ROS: Dementia    MEDICATIONS AT HOME:   Prior to Admission medications   Medication Sig Start Date End Date Taking? Authorizing Provider  acetaminophen (TYLENOL) 500 MG tablet Take 500 mg by mouth every 4 (four) hours as needed.   Yes Historical Provider, MD  ALPRAZolam (XANAX) 0.25 MG tablet Take 0.25 mg by mouth 3 (three) times daily as needed for anxiety. 7829,5621,3086   Yes Historical Provider, MD  alum & mag hydroxide-simeth (MINTOX) 200-200-20 MG/5ML suspension Take 30 mLs by mouth every 6 (six) hours as needed for indigestion or heartburn.   Yes Historical Provider, MD  Cholecalciferol 1000 units tablet Take 2,000 Units by mouth daily.   Yes Historical Provider, MD  clonazePAM (KLONOPIN) 0.5 MG tablet Take 0.5 mg by mouth 2 (two) times daily.   Yes Historical Provider, MD  divalproex (DEPAKOTE SPRINKLE) 125 MG capsule Take by mouth 3 (three) times daily. 0800,2000    Yes Historical Provider, MD  donepezil (ARICEPT) 10 MG tablet Take 10 mg by mouth at bedtime.   Yes Historical Provider, MD  fexofenadine (ALLEGRA) 180 MG tablet Take 180 mg by mouth daily.   Yes Historical Provider, MD  gabapentin (NEURONTIN) 100 MG capsule Take 100 mg by mouth 2 (two) times daily.   Yes Historical Provider, MD  guaifenesin (ROBITUSSIN) 100 MG/5ML syrup Take 200 mg by mouth every 6 (six) hours as needed for cough.   Yes Historical Provider, MD  loperamide (IMODIUM) 2 MG capsule Take 2 mg by mouth as needed for diarrhea or loose stools.   Yes Historical Provider, MD  magnesium hydroxide (MILK OF MAGNESIA) 400 MG/5ML suspension Take 30 mLs by mouth daily as needed for mild constipation.   Yes Historical Provider, MD  Melatonin 5 MG TABS Take by  mouth.   Yes Historical Provider, MD  oseltamivir (TAMIFLU) 75 MG capsule Take 75 mg by mouth 2 (two) times daily.   Yes Historical Provider, MD  QUEtiapine (SEROQUEL) 25 MG tablet Take 50 mg by mouth 2 (two) times daily.   Yes Historical Provider, MD  sertraline (ZOLOFT) 50 MG tablet Take 50 mg by mouth daily.   Yes Historical Provider, MD  cephALEXin (KEFLEX) 500 MG capsule Take 1 capsule (500 mg total) by mouth 2 (two) times daily. Patient not taking: Reported on 04/10/2016 01/15/16   Loleta Roseory Forbach, MD  traMADol (ULTRAM) 50 MG tablet Take 1 tablet (50 mg total) by mouth every 6 (six) hours as needed. Patient not taking: Reported on 04/10/2016 02/24/16 02/23/17  Minna AntisKevin Paduchowski, MD      VITAL SIGNS:  Blood pressure 120/70, pulse 64, temperature 98.2 F (36.8 C), temperature source Oral, resp. rate 14, height 5\' 4"  (1.626 m), weight 81.6 kg (180 lb), SpO2 94 %.  PHYSICAL EXAMINATION:  Physical Exam  GENERAL:  83 y.o.-year-old patient lying in the bed in no acute distress.  EYES: Pupils equal, round, reactive to light and accommodation. No scleral icterus. Extraocular muscles intact.  HEENT: Head atraumatic, normocephalic. Oropharynx and nasopharynx clear. No oropharyngeal erythema, moist oral mucosa  NECK:  Supple, no jugular venous distention. No thyroid enlargement, no tenderness.  LUNGS: Normal breath sounds bilaterally, no wheezing, rales, rhonchi. No use of accessory muscles of respiration.  CARDIOVASCULAR: S1, S2 RRR. No murmurs, rubs, gallops, clicks.  ABDOMEN: Soft, nontender, nondistended. Bowel sounds present. No organomegaly or mass.  EXTREMITIES: No pedal edema, cyanosis, or clubbing. + 2 pedal & radial pulses b/l.   NEUROLOGIC: Cranial nerves II through XII are intact. No focal Motor or sensory deficits appreciated b/l PSYCHIATRIC: The patient is alert and oriented x 1.   SKIN: No obvious rash, lesion, or ulcer.   LABORATORY PANEL:   CBC  Recent Labs Lab 04/10/16 1615   WBC 6.6  HGB 13.0  HCT 39.1  PLT 298   ------------------------------------------------------------------------------------------------------------------  Chemistries   Recent Labs Lab 04/10/16 1615  NA 139  K 4.2  CL 100*  CO2 33*  GLUCOSE 96  BUN 21*  CREATININE 0.66  CALCIUM 8.9  AST 17  ALT 16  ALKPHOS 47  BILITOT 0.4   ------------------------------------------------------------------------------------------------------------------  Cardiac Enzymes No results for input(s): TROPONINI in the last 168 hours. ------------------------------------------------------------------------------------------------------------------  RADIOLOGY:  Ct Head Wo Contrast  Result Date: 04/10/2016 CLINICAL DATA:  Patient is a resident at [facility] in the Memory Care unit. Daughter was called today by staff and said that patient more confused today and not steady on feet. Patient's roommate was diagnosed with flu last week. EXAM: CT HEAD WITHOUT CONTRAST TECHNIQUE: Contiguous axial images were obtained from the base of the skull through the vertex without intravenous contrast. COMPARISON:  03/04/2009 FINDINGS: Brain: No evidence of acute infarction, hemorrhage,  extra-axial collection, ventriculomegaly, or mass effect. Generalized cerebral atrophy. Periventricular white matter low attenuation likely secondary to microangiopathy. Vascular: Cerebrovascular atherosclerotic calcifications are noted. Skull: Negative for fracture or focal lesion. Sinuses/Orbits: Visualized portions of the orbits are unremarkable. Visualized portions of the paranasal sinuses and mastoid air cells are unremarkable. Other: None. IMPRESSION: 1. No acute intracranial pathology. 2. Chronic microvascular disease and cerebral atrophy. Electronically Signed   By: Elige Ko   On: 04/10/2016 18:51   Dg Chest Portable 1 View  Result Date: 04/10/2016 CLINICAL DATA:  LEFT-sided chest pain and back pain EXAM: PORTABLE CHEST 1  VIEW COMPARISON:  02/24/2016 FINDINGS: Normal cardiac silhouette. There is coarsened central bronchovascular markings. Low lung volumes. No focal infiltrate. No pneumothorax. IMPRESSION: Findings suggest bronchitis. Electronically Signed   By: Genevive Bi M.D.   On: 04/10/2016 19:02     IMPRESSION AND PLAN:   83 year old female with past medical history of vascular dementia, hypertension, hyperlipidemia, sensory the hospital from assisted living due to slurred speech and altered mental status.  1. TIA/CVA-this is a suspected diagnoses given patient's transient neurologic symptoms which have not improved in the emergency room. -Patient's CT head is negative for any acute pathology. I will get MRI of the brain, carotid duplex a two-dimensional echocardiogram.  -continue aspirin, will check a lipid profile.  2. Altered mental status-secondary to probably underlying dementia complicated with possible underlying CVA. -No acute infectious process, check depakote level.   3. Anxiety - cont. Klonopin, Xanax.   4. Hx of dementia w/ Behavioral disturbance - cont. Seroquel, Aricept, Depakote.   5. Depression - cont. Zoloft  All the records are reviewed and case discussed with ED provider. Management plans discussed with the patient, family and they are in agreement.  CODE STATUS: Full code  TOTAL TIME TAKING CARE OF THIS PATIENT: 45 minutes.    Houston Siren M.D on 04/10/2016 at 7:50 PM  Between 7am to 6pm - Pager - [phone]  After 6pm go to [email or web address] - password [email or web address] [facility]  [facility]  Office  [phone]  CC: Primary care physician; Rene Paci, MD

## 2016-04-10 NOTE — ED Notes (Signed)
Admitting MD at bedside.

## 2016-04-10 NOTE — ED Notes (Signed)
Pt's son in law to front desk with c/o patient now slurring speech. Pt neurologically intact, unable to raise right eyebrow.

## 2016-04-10 NOTE — Progress Notes (Signed)
   [facility] - [place] at [facility]   Advance care planning  Hospital Day: 0 days Katie Hansen is a 83 y.o. female presenting with Altered Mental Status slurred speech & suspected CVA.   Advance care planning discussed with patient  with additional Family at bedside. All questions in regards to overall condition and expected prognosis answered. The decision was made to continue current code status  CODE STATUS: full Time spent: 15 minutes

## 2016-04-10 NOTE — ED Triage Notes (Signed)
Patient is a resident at [facility] in the Memory Care unit.  Daughter was called today by staff and said that patient more confused today and not steady on feet.  Patient's roommate was diagnosed with flu last week.

## 2016-04-11 ENCOUNTER — Observation Stay: Payer: Medicare Other

## 2016-04-11 ENCOUNTER — Observation Stay: Admit: 2016-04-11 | Payer: Medicare Other

## 2016-04-11 DIAGNOSIS — G9340 Encephalopathy, unspecified: Secondary | ICD-10-CM | POA: Diagnosis not present

## 2016-04-11 LAB — LIPID PANEL
Cholesterol: 154 mg/dL (ref 0–200)
HDL: 41 mg/dL (ref >40)
LDL Cholesterol: 90 mg/dL (ref 0–99)
Total CHOL/HDL Ratio: 3.8 RATIO
Triglycerides: 116 mg/dL (ref <150)
VLDL: 23 mg/dL (ref 0–40)

## 2016-04-11 LAB — MRSA PCR SCREENING: MRSA BY PCR: NEGATIVE

## 2016-04-11 LAB — VALPROIC ACID LEVEL: VALPROIC ACID LVL: 64 ug/mL (ref 50.0–100.0)

## 2016-04-11 MED ORDER — TRAZODONE HCL 50 MG PO TABS
50.0000 mg | ORAL_TABLET | Freq: Once | ORAL | Status: DC
  Filled 2016-04-11: qty 1

## 2016-04-11 MED ORDER — ASPIRIN 81 MG PO TBEC: 81.0000 mg | DELAYED_RELEASE_TABLET | Freq: Every day | ORAL | 0 refills | Status: AC

## 2016-04-11 MED ORDER — LORAZEPAM 2 MG/ML IJ SOLN
2.0000 mg | Freq: Once | INTRAMUSCULAR | Status: AC
  Administered 2016-04-11: 2 mg via INTRAMUSCULAR
  Filled 2016-04-11: qty 1

## 2016-04-11 MED ORDER — AZITHROMYCIN 250 MG PO TABS: 250.0000 mg | ORAL_TABLET | Freq: Every day | ORAL | Status: DC

## 2016-04-11 MED ORDER — HALOPERIDOL LACTATE 5 MG/ML IJ SOLN
1.0000 mg | Freq: Once | INTRAMUSCULAR | Status: AC
  Administered 2016-04-11: 1 mg via INTRAMUSCULAR
  Filled 2016-04-11: qty 1

## 2016-04-11 MED ORDER — AZITHROMYCIN 250 MG PO TABS: ORAL_TABLET | ORAL | 0 refills | Status: DC

## 2016-04-11 MED ORDER — AZITHROMYCIN 500 MG PO TABS
500.0000 mg | ORAL_TABLET | Freq: Every day | ORAL | Status: AC
  Administered 2016-04-11: 10:00:00 500 mg via ORAL
  Filled 2016-04-11: qty 1

## 2016-04-11 NOTE — Plan of Care (Signed)
Problem: Pain Managment: Goal: General experience of comfort will improve Outcome: Not Progressing Pt is confused and very agitated  Problem: Fluid Volume: Goal: Ability to maintain a balanced intake and output will improve Outcome: Not Progressing Pt remove IV while IV fluids was being administered

## 2016-04-11 NOTE — Progress Notes (Signed)
Pt discharged via wheelchair by nursing to the visitor's entrance 

## 2016-04-11 NOTE — Discharge Summary (Signed)
[facility] - [place] at [facility]   PATIENT NAME: Amos Gaber    MR#:  [id number]  DATE OF BIRTH:  January 06, 1928  DATE OF ADMISSION:  04/10/2016 ADMITTING PHYSICIAN: Houston Siren, MD  DATE OF DISCHARGE: 04/11/2016  PRIMARY CARE PHYSICIAN: Rene Paci, MD    ADMISSION DIAGNOSIS:  Transient cerebral ischemia, unspecified type [G45.9]  DISCHARGE DIAGNOSIS:  Active Problems:   CVA (cerebral vascular accident) (HCC)   SECONDARY DIAGNOSIS:   Past Medical History:  Diagnosis Date  . Allergic rhinitis, cause unspecified   . Dementia   . Dyslipidemia    high HDL and LDL  . Hypertension     HOSPITAL COURSE:   1.  Acute encephalopathy. Patient had some slurred speech and not interacting as she normally does. People have the flu with her facility and she was started empirically on Tamiflu. Patient improved with an IV fluid bolus here in the hospital. MRI of the brain was negative for stroke. Carotid ultrasound did show some plaque but no surgical intervention needed just routine follow-up yearly. 2. Acute bronchitis. Start Zithromax. 3. Dementia without behavioral disturbance continue usual medications 4. Anxiety on Klonopin and Xanax  DISCHARGE CONDITIONS:   Satisfactory  CONSULTS OBTAINED:   none  DRUG ALLERGIES:  No Known Allergies  DISCHARGE MEDICATIONS:   Current Discharge Medication List    START taking these medications   Details  aspirin EC 81 MG EC tablet Take 1 tablet (81 mg total) by mouth daily. Qty: 30 tablet, Refills: 0    azithromycin (ZITHROMAX) 250 MG tablet One tab daily for four days Qty: 4 each, Refills: 0      CONTINUE these medications which have NOT CHANGED   Details  acetaminophen (TYLENOL) 500 MG tablet Take 500 mg by mouth every 4 (four) hours as needed.    ALPRAZolam (XANAX) 0.25 MG tablet Take 0.25 mg by mouth 3 (three) times daily as needed for anxiety. 0800,1400,2000    alum & mag hydroxide-simeth (MINTOX)  200-200-20 MG/5ML suspension Take 30 mLs by mouth every 6 (six) hours as needed for indigestion or heartburn.    Cholecalciferol 1000 units tablet Take 2,000 Units by mouth daily.    clonazePAM (KLONOPIN) 0.5 MG tablet Take 0.5 mg by mouth 2 (two) times daily.    divalproex (DEPAKOTE SPRINKLE) 125 MG capsule Take by mouth 3 (three) times daily. 0800,2000     donepezil (ARICEPT) 10 MG tablet Take 10 mg by mouth at bedtime.    fexofenadine (ALLEGRA) 180 MG tablet Take 180 mg by mouth daily.    gabapentin (NEURONTIN) 100 MG capsule Take 100 mg by mouth 2 (two) times daily.    guaifenesin (ROBITUSSIN) 100 MG/5ML syrup Take 200 mg by mouth every 6 (six) hours as needed for cough.    loperamide (IMODIUM) 2 MG capsule Take 2 mg by mouth as needed for diarrhea or loose stools.    magnesium hydroxide (MILK OF MAGNESIA) 400 MG/5ML suspension Take 30 mLs by mouth daily as needed for mild constipation.    Melatonin 5 MG TABS Take by mouth.    oseltamivir (TAMIFLU) 75 MG capsule Take 75 mg by mouth 2 (two) times daily.    QUEtiapine (SEROQUEL) 25 MG tablet Take 50 mg by mouth 2 (two) times daily.    sertraline (ZOLOFT) 50 MG tablet Take 50 mg by mouth daily.      STOP taking these medications     cephALEXin (KEFLEX) 500 MG capsule      traMADol (  ULTRAM) 50 MG tablet          DISCHARGE INSTRUCTIONS:   Follow-up with PMD one week  If you experience worsening of your admission symptoms, develop shortness of breath, life threatening emergency, suicidal or homicidal thoughts you must seek medical attention immediately by calling 911 or calling your MD immediately  if symptoms less severe.  You Must read complete instructions/literature along with all the possible adverse reactions/side effects for all the Medicines you take and that have been prescribed to you. Take any new Medicines after you have completely understood and accept all the possible adverse reactions/side effects.   Please  note  You were cared for by a hospitalist during your hospital stay. If you have any questions about your discharge medications or the care you received while you were in the hospital after you are discharged, you can call the unit and asked to speak with the hospitalist on call if the hospitalist that took care of you is not available. Once you are discharged, your primary care physician will handle any further medical issues. Please note that NO REFILLS for any discharge medications will be authorized once you are discharged, as it is imperative that you return to your primary care physician (or establish a relationship with a primary care physician if you do not have one) for your aftercare needs so that they can reassess your need for medications and monitor your lab values.    Today   CHIEF COMPLAINT:   Chief Complaint  Patient presents with  . Altered Mental Status    HISTORY OF PRESENT ILLNESS:  Ambreen Tufte  is a 83 y.o. female with a known History of dementia presented with altered mental status and slurred speech   VITAL SIGNS:  Blood pressure (!) 126/44, pulse 61, temperature 97.4 F (36.3 C), temperature source Oral, resp. rate 20, height 5\' 4"  (1.626 m), weight 68.3 kg (150 lb 8 oz), SpO2 98 %.   PHYSICAL EXAMINATION:  GENERAL:  83 y.o.-year-old patient lying in the bed with no acute distress.  EYES: Pupils equal, round, reactive to light and accommodation. No scleral icterus. Extraocular muscles intact.  HEENT: Head atraumatic, normocephalic. Oropharynx and nasopharynx clear.  NECK:  Supple, no jugular venous distention. No thyroid enlargement, no tenderness.  LUNGS: Coarse breath sounds bilaterally, no wheezing, rales,rhonchi or crepitation. No use of accessory muscles of respiration.  CARDIOVASCULAR: S1, S2 normal. No murmurs, rubs, or gallops.  ABDOMEN: Soft, non-tender, non-distended. Bowel sounds present. No organomegaly or mass.  EXTREMITIES: Trace edema, no cyanosis,  or clubbing.  NEUROLOGIC: Cranial nerves II through XII are intact. Muscle strength 5/5 in all extremities. Sensation intact. Gait not checked.  PSYCHIATRIC: The patient is alert and answers questions appropriately.  SKIN: No obvious rash, lesion, or ulcer.   DATA REVIEW:   CBC  Recent Labs Lab 04/10/16 1615  WBC 6.6  HGB 13.0  HCT 39.1  PLT 298    Chemistries   Recent Labs Lab 04/10/16 1615  NA 139  K 4.2  CL 100*  CO2 33*  GLUCOSE 96  BUN 21*  CREATININE 0.66  CALCIUM 8.9  AST 17  ALT 16  ALKPHOS 47  BILITOT 0.4    Cardiac Enzymes  Recent Labs Lab 04/10/16 1615  TROPONINI <0.03    Microbiology Results  Results for orders placed or performed during the hospital encounter of 04/10/16  MRSA PCR Screening     Status: None   Collection Time: 04/10/16 10:50 PM  Result  Value Ref Range Status   MRSA by PCR NEGATIVE NEGATIVE Final    Comment:        The GeneXpert MRSA Assay (FDA approved for NASAL specimens only), is one component of a comprehensive MRSA colonization surveillance program. It is not intended to diagnose MRSA infection nor to guide or monitor treatment for MRSA infections.     RADIOLOGY:  Ct Head Wo Contrast  Result Date: 04/10/2016 CLINICAL DATA:  Patient is a resident at [facility] in the Memory Care unit. Daughter was called today by staff and said that patient more confused today and not steady on feet. Patient's roommate was diagnosed with flu last week. EXAM: CT HEAD WITHOUT CONTRAST TECHNIQUE: Contiguous axial images were obtained from the base of the skull through the vertex without intravenous contrast. COMPARISON:  03/04/2009 FINDINGS: Brain: No evidence of acute infarction, hemorrhage, extra-axial collection, ventriculomegaly, or mass effect. Generalized cerebral atrophy. Periventricular white matter low attenuation likely secondary to microangiopathy. Vascular: Cerebrovascular atherosclerotic calcifications are noted. Skull:  Negative for fracture or focal lesion. Sinuses/Orbits: Visualized portions of the orbits are unremarkable. Visualized portions of the paranasal sinuses and mastoid air cells are unremarkable. Other: None. IMPRESSION: 1. No acute intracranial pathology. 2. Chronic microvascular disease and cerebral atrophy. Electronically Signed   By: Elige KoHetal  Patel   On: 04/10/2016 18:51   Mr Brain Wo Contrast  Result Date: 04/11/2016 CLINICAL DATA:  History of vascular dementia and hypertension. Mental status changes and slurred speech of acute onset. EXAM: MRI HEAD WITHOUT CONTRAST TECHNIQUE: Multiplanar, multiecho pulse sequences of the brain and surrounding structures were obtained without intravenous contrast. COMPARISON:  CT 04/10/2016 FINDINGS: Brain: Diffusion imaging does not show any acute or subacute infarction. The brainstem and cerebellum are unremarkable. Cerebral hemispheres show mild generalized atrophy with mild chronic small-vessel ischemic change of the deep white matter. No cortical or large vessel territory infarction. No mass lesion, hemorrhage, hydrocephalus or extra-axial collection. Vascular: Major vessels at the base of the brain show flow. Skull and upper cervical spine: Negative Sinuses/Orbits: Mild mucosal inflammation of the paranasal sinuses. Orbits negative. Other: None significant IMPRESSION: No acute finding by MRI. Brain atrophy. Mild chronic small-vessel ischemic change of the hemispheric white matter. Electronically Signed   By: Paulina FusiMark  Shogry M.D.   On: 04/11/2016 09:10   [place] Carotid Bilateral  Result Date: 04/11/2016 CLINICAL DATA:  Hypertension, syncope, hyperlipidemia and altered mental status. EXAM: BILATERAL CAROTID DUPLEX ULTRASOUND TECHNIQUE: Wallace CullensGray scale imaging, color Doppler and duplex ultrasound were performed of bilateral carotid and vertebral arteries in the neck. COMPARISON:  None. FINDINGS: Criteria: Quantification of carotid stenosis is based on velocity parameters that  correlate the residual internal carotid diameter with NASCET-based stenosis levels, using the diameter of the distal internal carotid lumen as the denominator for stenosis measurement. The following velocity measurements were obtained: RIGHT ICA:  59/9 cm/sec CCA:  65/13 cm/sec SYSTOLIC ICA/CCA RATIO:  0.9 DIASTOLIC ICA/CCA RATIO:  0.7 ECA:  82 cm/sec LEFT ICA:  46/11 cm/sec CCA:  75/8 cm/sec SYSTOLIC ICA/CCA RATIO:  0.6 DIASTOLIC ICA/CCA RATIO:  1.4 ECA:  62 cm/sec RIGHT CAROTID ARTERY: Partially calcified mixed plaque is present at the level of the carotid bulb and just extends into the ICA origin. Velocities and waveforms are normal. Estimated right ICA stenosis is less than 50%. RIGHT VERTEBRAL ARTERY: Antegrade flow with normal waveform and velocity. LEFT CAROTID ARTERY: Mild amount of calcified plaque is present at the level of the distal bulb and proximal ICA. Velocities and waveforms  are normal. Estimated left ICA stenosis is less than 50%. LEFT VERTEBRAL ARTERY: Antegrade flow with normal waveform and velocity. IMPRESSION: Mild amount of plaque at the level of both carotid bulbs and ICA origins. No significant carotid stenosis identified with bilateral estimated ICA stenoses of less than 50%. Electronically Signed   By: Irish Lack M.D.   On: 04/11/2016 09:29   Dg Chest Portable 1 View  Result Date: 04/10/2016 CLINICAL DATA:  LEFT-sided chest pain and back pain EXAM: PORTABLE CHEST 1 VIEW COMPARISON:  02/24/2016 FINDINGS: Normal cardiac silhouette. There is coarsened central bronchovascular markings. Low lung volumes. No focal infiltrate. No pneumothorax. IMPRESSION: Findings suggest bronchitis. Electronically Signed   By: Genevive Bi M.D.   On: 04/10/2016 19:02    Management plans discussed with the patient, family and they are in agreement.  CODE STATUS:     Code Status Orders        Start     Ordered   04/10/16 2137  Full code  Continuous     04/10/16 2136    Code Status  History    Date Active Date Inactive Code Status Order ID Comments User Context   This patient has a current code status but no historical code status.    Advance Directive Documentation   Flowsheet Row Most Recent Value  Type of Advance Directive  Healthcare Power of Attorney  Pre-existing out of facility DNR order (yellow form or pink MOST form)  No data  "MOST" Form in Place?  No data      TOTAL TIME TAKING CARE OF THIS PATIENT: 32 minutes.    Alford Highland M.D on 04/11/2016 at 9:58 AM  Between 7am to 6pm - Pager - [phone]  After 6pm go to [email or web address] - password [email or web address]  [employer] [facility] Office  [phone]  CC: Primary care physician; Rene Paci, MD

## 2016-04-11 NOTE — Clinical Social Work Note (Signed)
Patient to dc to [facility] via transport by family. The facility and the patient's family members are aware. CSW will con't to follow pending additional dc needs.  Katie Hansen, MSW, Theresia MajorsLCSWA [phone]

## 2016-04-11 NOTE — NC FL2 (Signed)
[place] MEDICAID FL2 LEVEL OF CARE SCREENING TOOL     IDENTIFICATION  Patient Name: Katie Hansen Birthdate: 07-02-1927 Sex: female Admission Date (Current Location): 04/10/2016  [facility] and [place] Number:  Chiropodist and Address:  [facility], [address], [place], [place] [address]      Provider Number: [id number]  Attending Physician Name and Address:  Alford Highland, MD  Relative Name and Phone Number:       Current Level of Care: Hospital Recommended Level of Care: Assisted Living Facility Prior Approval Number:    Date Approved/Denied:   PASRR Number:    Discharge Plan: Domiciliary (Rest home)    Current Diagnoses: Patient Active Problem List   Diagnosis Date Noted  . CVA (cerebral vascular accident) (HCC) 04/10/2016  . Toenail fungus 08/13/2014  . Hyperglycemia 02/08/2014  . Osteoporosis, senile   . ALLERGIC RHINITIS 02/25/2010  . Dementia without behavioral disturbance 03/28/2009  . Essential hypertension 03/28/2009    Orientation RESPIRATION BLADDER Height & Weight     Self, Time, Situation, Place  Normal Incontinent Weight: 150 lb 8 oz (68.3 kg) Height:  5\' 4"  (162.6 cm)  BEHAVIORAL SYMPTOMS/MOOD NEUROLOGICAL BOWEL NUTRITION STATUS      Incontinent    AMBULATORY STATUS COMMUNICATION OF NEEDS Skin   Supervision Verbally Normal                       Personal Care Assistance Level of Assistance  Bathing, Dressing Bathing Assistance: Limited assistance   Dressing Assistance: Limited assistance     Functional Limitations Info             SPECIAL CARE FACTORS FREQUENCY                       Contractures Contractures Info: Present    Additional Factors Info                  Current Medications (04/11/2016):  This is the current hospital active medication list Current Facility-Administered Medications  Medication Dose Route Frequency Provider Last Rate Last Dose  .  acetaminophen (TYLENOL) tablet 650 mg  650 mg Oral Q6H PRN Houston Siren, MD       Or  . acetaminophen (TYLENOL) suppository 650 mg  650 mg Rectal Q6H PRN Houston Siren, MD      . ALPRAZolam Prudy Feeler) tablet 0.25 mg  0.25 mg Oral TID PRN Houston Siren, MD      . alum & mag hydroxide-simeth (MAALOX/MYLANTA) 200-200-20 MG/5ML suspension 30 mL  30 mL Oral Q6H PRN Houston Siren, MD      . aspirin EC tablet 81 mg  81 mg Oral Daily Houston Siren, MD   81 mg at 04/11/16 0951  . [START ON 04/12/2016] azithromycin (ZITHROMAX) tablet 250 mg  250 mg Oral Daily Alford Highland, MD      . cholecalciferol (VITAMIN D) tablet 2,000 Units  2,000 Units Oral Daily Houston Siren, MD   2,000 Units at 04/11/16 0950  . clonazePAM (KLONOPIN) tablet 0.5 mg  0.5 mg Oral BID Houston Siren, MD   0.5 mg at 04/11/16 0950  . divalproex (DEPAKOTE SPRINKLE) capsule 125 mg  125 mg Oral TID Houston Siren, MD   125 mg at 04/11/16 0950  . donepezil (ARICEPT) tablet 10 mg  10 mg Oral QHS Houston Siren, MD   10 mg at 04/10/16 2254  .  enoxaparin (LOVENOX) injection 40 mg  40 mg Subcutaneous Q24H Houston Siren, MD   40 mg at 04/10/16 2254  . gabapentin (NEURONTIN) capsule 100 mg  100 mg Oral BID Houston Siren, MD   100 mg at 04/11/16 0950  . guaifenesin (ROBITUSSIN) 100 MG/5ML syrup 200 mg  200 mg Oral Q6H PRN Houston Siren, MD      . loperamide (IMODIUM) capsule 2 mg  2 mg Oral PRN Houston Siren, MD      . loratadine (CLARITIN) tablet 10 mg  10 mg Oral Daily Houston Siren, MD   10 mg at 04/11/16 0950  . magnesium hydroxide (MILK OF MAGNESIA) suspension 30 mL  30 mL Oral Daily PRN Houston Siren, MD      . ondansetron (ZOFRAN) tablet 4 mg  4 mg Oral Q6H PRN Houston Siren, MD       Or  . ondansetron (ZOFRAN) injection 4 mg  4 mg Intravenous Q6H PRN Houston Siren, MD      . oseltamivir (TAMIFLU) capsule 75 mg  75 mg Oral BID Houston Siren, MD   75 mg at 04/11/16 0950  . QUEtiapine (SEROQUEL) tablet 50 mg   50 mg Oral BID Houston Siren, MD   50 mg at 04/11/16 0950  . sertraline (ZOLOFT) tablet 50 mg  50 mg Oral Daily Houston Siren, MD   50 mg at 04/11/16 0951  . traZODone (DESYREL) tablet 50 mg  50 mg Oral Once [employer], DO         Discharge Medications: DISCHARGE MEDICATIONS:       Current Discharge Medication List        START taking these medications   Details  aspirin EC 81 MG EC tablet Take 1 tablet (81 mg total) by mouth daily. Qty: 30 tablet, Refills: 0    azithromycin (ZITHROMAX) 250 MG tablet One tab daily for four days Qty: 4 each, Refills: 0          CONTINUE these medications which have NOT CHANGED   Details  acetaminophen (TYLENOL) 500 MG tablet Take 500 mg by mouth every 4 (four) hours as needed.    ALPRAZolam (XANAX) 0.25 MG tablet Take 0.25 mg by mouth 3 (three) times daily as needed for anxiety. 0800,1400,2000    alum & mag hydroxide-simeth (MINTOX) 200-200-20 MG/5ML suspension Take 30 mLs by mouth every 6 (six) hours as needed for indigestion or heartburn.    Cholecalciferol 1000 units tablet Take 2,000 Units by mouth daily.    clonazePAM (KLONOPIN) 0.5 MG tablet Take 0.5 mg by mouth 2 (two) times daily.    divalproex (DEPAKOTE SPRINKLE) 125 MG capsule Take by mouth 3 (three) times daily. 0800,2000     donepezil (ARICEPT) 10 MG tablet Take 10 mg by mouth at bedtime.    fexofenadine (ALLEGRA) 180 MG tablet Take 180 mg by mouth daily.    gabapentin (NEURONTIN) 100 MG capsule Take 100 mg by mouth 2 (two) times daily.    guaifenesin (ROBITUSSIN) 100 MG/5ML syrup Take 200 mg by mouth every 6 (six) hours as needed for cough.    loperamide (IMODIUM) 2 MG capsule Take 2 mg by mouth as needed for diarrhea or loose stools.    magnesium hydroxide (MILK OF MAGNESIA) 400 MG/5ML suspension Take 30 mLs by mouth daily as needed for mild constipation.    Melatonin 5 MG TABS Take by mouth.    oseltamivir (TAMIFLU) 75 MG capsule Take  75  mg by mouth 2 (two) times daily.    QUEtiapine (SEROQUEL) 25 MG tablet Take 50 mg by mouth 2 (two) times daily.    sertraline (ZOLOFT) 50 MG tablet Take 50 mg by mouth daily.         STOP taking these medications     cephALEXin (KEFLEX) 500 MG capsule      traMADol (ULTRAM) 50 MG tablet          Relevant Imaging Results:  Relevant Lab Results:   Additional Information SS# [id number]  Judi CongKaren M Lorriann Hansmann, LCSW

## 2016-04-11 NOTE — Progress Notes (Signed)
MD order received to discharge pt back to [facility] today; Care Management previously prepared discharge packet for pt's family to take to [facility]; verbally reviewed AVS with pt's daughter, Onnie BoerJennifer Clark, no questions voiced at this time

## 2016-04-11 NOTE — Care Management Obs Status (Signed)
MEDICARE OBSERVATION STATUS NOTIFICATION   Patient Details  Name: Katie Hansen MRN: [id number] Date of Birth: 1927-04-15   Medicare Observation Status Notification Given:  No (discharge order in less than 24 hours)    Caren MacadamMichelle Annye Forrey, RN 04/11/2016, 10:06 AM

## 2016-04-13 ENCOUNTER — Emergency Department: Payer: Medicare Other

## 2016-04-13 ENCOUNTER — Emergency Department
Admission: EM | Admit: 2016-04-13 | Discharge: 2016-04-13 | Disposition: A | Discharge status: Home / Self Care | Payer: Medicare Other | Attending: Emergency Medicine | Admitting: Emergency Medicine

## 2016-04-13 DIAGNOSIS — W19XXXA Unspecified fall, initial encounter: Secondary | ICD-10-CM | POA: Diagnosis not present

## 2016-04-13 DIAGNOSIS — Z79899 Other long term (current) drug therapy: Secondary | ICD-10-CM | POA: Diagnosis not present

## 2016-04-13 DIAGNOSIS — R4182 Altered mental status, unspecified: Secondary | ICD-10-CM | POA: Diagnosis present

## 2016-04-13 DIAGNOSIS — Z7982 Long term (current) use of aspirin: Secondary | ICD-10-CM | POA: Insufficient documentation

## 2016-04-13 DIAGNOSIS — I10 Essential (primary) hypertension: Secondary | ICD-10-CM | POA: Insufficient documentation

## 2016-04-13 DIAGNOSIS — Y9289 Other specified places as the place of occurrence of the external cause: Secondary | ICD-10-CM | POA: Insufficient documentation

## 2016-04-13 DIAGNOSIS — Y999 Unspecified external cause status: Secondary | ICD-10-CM | POA: Insufficient documentation

## 2016-04-13 DIAGNOSIS — N390 Urinary tract infection, site not specified: Secondary | ICD-10-CM | POA: Insufficient documentation

## 2016-04-13 DIAGNOSIS — Y939 Activity, unspecified: Secondary | ICD-10-CM | POA: Insufficient documentation

## 2016-04-13 LAB — COMPREHENSIVE METABOLIC PANEL
ALBUMIN: 3.5 g/dL (ref 3.5–5.0)
ALK PHOS: 43 U/L (ref 38–126)
ALT: 16 U/L (ref 14–54)
ANION GAP: 5 (ref 5–15)
AST: 19 U/L (ref 15–41)
BUN: 17 mg/dL (ref 6–20)
CALCIUM: 9.1 mg/dL (ref 8.9–10.3)
CHLORIDE: 105 mmol/L (ref 101–111)
CO2: 33 mmol/L — AB (ref 22–32)
Creatinine, Ser: 0.89 mg/dL (ref 0.44–1.00)
GFR calc Af Amer: >60 mL/min (ref >60)
GFR calc non Af Amer: 57 mL/min — ABNORMAL LOW (ref >60)
GLUCOSE: 101 mg/dL — AB (ref 65–99)
Potassium: 4 mmol/L (ref 3.5–5.1)
SODIUM: 143 mmol/L (ref 135–145)
Total Bilirubin: 0.5 mg/dL (ref 0.3–1.2)
Total Protein: 6.6 g/dL (ref 6.5–8.1)

## 2016-04-13 LAB — CBC
HEMATOCRIT: 39.5 % (ref 35.0–47.0)
HEMOGLOBIN: 13.2 g/dL (ref 12.0–16.0)
MCH: 27.7 pg (ref 26.0–34.0)
MCHC: 33.3 g/dL (ref 32.0–36.0)
MCV: 83.2 fL (ref 80.0–100.0)
Platelets: 318 10*3/uL (ref 150–440)
RBC: 4.75 MIL/uL (ref 3.80–5.20)
RDW: 14.2 % (ref 11.5–14.5)
WBC: 7.7 10*3/uL (ref 3.6–11.0)

## 2016-04-13 LAB — URINALYSIS, COMPLETE (UACMP) WITH MICROSCOPIC
BACTERIA UA: NONE SEEN
BILIRUBIN URINE: NEGATIVE
Glucose, UA: NEGATIVE mg/dL
Hgb urine dipstick: NEGATIVE
Ketones, ur: 20 mg/dL — AB
Nitrite: NEGATIVE
PROTEIN: 100 mg/dL — AB
SQUAMOUS EPITHELIAL / LPF: NONE SEEN
Specific Gravity, Urine: 1.023 (ref 1.005–1.030)
pH: 5 (ref 5.0–8.0)

## 2016-04-13 MED ORDER — NITROFURANTOIN MACROCRYSTAL 100 MG PO CAPS: 100.0000 mg | ORAL_CAPSULE | Freq: Three times a day (TID) | ORAL | 0 refills | Status: AC

## 2016-04-13 NOTE — Discharge Instructions (Signed)
Please continue all medications and contact the primary physician for further outpatient follow-up. Please continue to encourage fluids. Significant injury was found during today's minor fall  Please return immediately if condition worsens. Please contact her primary physician or the physician you were given for referral. If you have any specialist physicians involved in her treatment and plan please also contact them. Thank you for using [place] regional emergency Department.

## 2016-04-13 NOTE — ED Triage Notes (Signed)
Pt comes into the ED via EMS from [facility] with c/o finding pt leaning again a wall, unwitnessed fall with no c/o injury on arrival. Also with c/o pt having change in mental status that the pt was seen for last week.. Pt has a hx of dementia..Marland Kitchen

## 2016-04-13 NOTE — ED Provider Notes (Signed)
Time Seen: Approximately 1647 I have reviewed the triage notes  Chief Complaint: Fall and Altered Mental Status   History of Present Illness: Katie Hansen is a 83 y.o. female who has a history of dementia and was recently evaluated for altered mental status. Patient was transported here from [place] house and was found leaning against the wall. There was no loss of consciousness no complaints of an injury. Been worked up for altered mental status and recently had an MRI of the brain 2 days ago which did not show any significant abnormalities. Denies any lightheadedness, chest pain or abdominal pain.   Past Medical History:  Diagnosis Date  . Allergic rhinitis, cause unspecified   . Dementia   . Dyslipidemia    high HDL and LDL  . Hypertension     Patient Active Problem List   Diagnosis Date Noted  . CVA (cerebral vascular accident) (HCC) 04/10/2016  . Toenail fungus 08/13/2014  . Hyperglycemia 02/08/2014  . Osteoporosis, senile   . ALLERGIC RHINITIS 02/25/2010  . Dementia without behavioral disturbance 03/28/2009  . Essential hypertension 03/28/2009    Past Surgical History:  Procedure Laterality Date  . ABDOMINAL HYSTERECTOMY    . APPENDECTOMY      Past Surgical History:  Procedure Laterality Date  . ABDOMINAL HYSTERECTOMY    . APPENDECTOMY      Current Outpatient Rx  . Order #: [id number] Class: Historical Med  . Order #: [id number] Class: Historical Med  . Order #: [id number] Class: Historical Med  . Order #: [id number] Class: Print  . Order #: [id number] Class: Print  . Order #: [id number] Class: Historical Med  . Order #: [id number] Class: Historical Med  . Order #: [id number] Class: Historical Med  . Order #: [id number] Class: Historical Med  . Order #: [id number] Class: Historical Med  . Order #: [id number] Class: Historical Med  . Order #: [id number] Class: Historical Med  . Order #: [id number] Class: Historical Med  . Order #: [id number] Class: Historical Med  . Order #:  [id number] Class: Historical Med  . Order #: [id number] Class: Historical Med  . Order #: [id number] Class: Historical Med  . Order #: [id number] Class: Historical Med    Allergies:  Patient has no known allergies.  Family History: Family History  Problem Relation Age of Onset  . Colon cancer Mother 41  . Tuberculosis Father     Social History: Social History  Substance Use Topics  . Smoking status: Never Smoker  . Smokeless tobacco: Never Used     Comment: Widowed for many years-spouse died in plane crash. 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 moved to [place] indep living [place]) in 08/2008 to be closer to dtr-lives alone  . Alcohol use No     Review of Systems:   10 point review of systems was performed and was otherwise negative:  Constitutional: No fever Eyes: No visual disturbances ENT: No sore throat, ear pain Cardiac: No chest pain Respiratory: No shortness of breath, wheezing, or stridor Abdomen: No abdominal pain, no vomiting, No diarrhea Endocrine: No weight loss, No night sweats Extremities: No peripheral edema, cyanosis Skin: No rashes, easy bruising Neurologic: No focal weakness, trouble with speech or swollowing Urologic: No dysuria, Hematuria, or urinary frequency   Physical Exam:  ED Triage Vitals [04/13/16 1631]  Enc Vitals Group     BP (!) 126/52     Pulse Rate 67     Resp 17     Temp 98 F (36.7 C)  Temp Source Oral     SpO2 98 %     Weight 148 lb 9.6 oz (67.4 kg)     Height 5\' 4"  (1.626 m)     Head Circumference      Peak Flow      Pain Score      Pain Loc      Pain Edu?      Excl. in GC?     General: Awake , Alert , and Oriented times 3; GCS 15 Head: Normal cephalic , atraumatic Eyes: Pupils equal , round, reactive to light Nose/Throat: No nasal drainage, patent upper airway without erythema or exudate.  Neck: Supple, Full range of motion, No anterior adenopathy  or palpable thyroid masses Lungs: Clear to ascultation without wheezes , rhonchi, or rales Heart: Regular rate, regular rhythm without murmurs , gallops , or rubs Abdomen: Soft, non tender without rebound, guarding , or rigidity; bowel sounds positive and symmetric in all 4 quadrants. No organomegaly .        Extremities: 2 plus symmetric pulses. No edema, clubbing or cyanosis Neurologic: normal ambulation, Motor symmetric without deficits, sensory intact Skin: warm, dry, no rashes   Labs:   All laboratory work was reviewed including any pertinent negatives or positives listed below:  Labs Reviewed  CBC  COMPREHENSIVE METABOLIC PANEL  URINALYSIS, COMPLETE (UACMP) WITH MICROSCOPIC    EKG: * ED ECG REPORT I, Jennye Moccasin, the attending physician, personally viewed and interpreted this ECG.  ED ECG REPORT I, Jennye Moccasin, the attending physician, personally viewed and interpreted this ECG.  Date: 04/13/2016 EKG Time: 1630 Rate:67 Rhythm: normal sinus rhythm QRS Axis: normal Intervals: normal ST/T Wave abnormalities: normal Conduction Disturbances: none Narrative Interpretation: unremarkable No acute ischemic changes  Radiology: *  "Dg Chest 2 View  Result Date: 04/13/2016 CLINICAL DATA:  Recent fall and altered mental status EXAM: CHEST  2 VIEW COMPARISON:  04/10/2016 FINDINGS: Cardiac shadow is mildly enlarged but stable. Mildly tortuous aorta is again seen. The lungs are well aerated bilaterally. No focal infiltrate or sizable effusion is seen. No acute bony abnormality is noted. IMPRESSION: No active cardiopulmonary disease. Electronically Signed   By: Alcide Clever M.D.   On: 04/13/2016 18:29   Ct Head Wo Contrast  Result Date: 04/10/2016 CLINICAL DATA:  Patient is a resident at [facility] in the Memory Care unit. Daughter was called today by staff and said that patient more confused today and not steady on feet. Patient's roommate was diagnosed with flu last  week. EXAM: CT HEAD WITHOUT CONTRAST TECHNIQUE: Contiguous axial images were obtained from the base of the skull through the vertex without intravenous contrast. COMPARISON:  03/04/2009 FINDINGS: Brain: No evidence of acute infarction, hemorrhage, extra-axial collection, ventriculomegaly, or mass effect. Generalized cerebral atrophy. Periventricular white matter low attenuation likely secondary to microangiopathy. Vascular: Cerebrovascular atherosclerotic calcifications are noted. Skull: Negative for fracture or focal lesion. Sinuses/Orbits: Visualized portions of the orbits are unremarkable. Visualized portions of the paranasal sinuses and mastoid air cells are unremarkable. Other: None. IMPRESSION: 1. No acute intracranial pathology. 2. Chronic microvascular disease and cerebral atrophy. Electronically Signed   By: Elige Ko   On: 04/10/2016 18:51   Mr Brain Wo Contrast  Result Date: 04/11/2016 CLINICAL DATA:  History of vascular dementia and hypertension. Mental status changes and slurred speech of acute onset. EXAM: MRI HEAD WITHOUT CONTRAST TECHNIQUE: Multiplanar, multiecho pulse sequences of the brain and surrounding structures were obtained without intravenous contrast. COMPARISON:  CT 04/10/2016 FINDINGS: Brain: Diffusion imaging does not show any acute or subacute infarction. The brainstem and cerebellum are unremarkable. Cerebral hemispheres show mild generalized atrophy with mild chronic small-vessel ischemic change of the deep white matter. No cortical or large vessel territory infarction. No mass lesion, hemorrhage, hydrocephalus or extra-axial collection. Vascular: Major vessels at the base of the brain show flow. Skull and upper cervical spine: Negative Sinuses/Orbits: Mild mucosal inflammation of the paranasal sinuses. Orbits negative. Other: None significant IMPRESSION: No acute finding by MRI. Brain atrophy. Mild chronic small-vessel ischemic change of the hemispheric white matter.  Electronically Signed   By: Paulina Fusi M.D.   On: 04/11/2016 09:10   US Carotid Bilateral  Result Date: 04/11/2016 CLINICAL DATA:  Hypertension, syncope, hyperlipidemia and altered mental status. EXAM: BILATERAL CAROTID DUPLEX ULTRASOUND TECHNIQUE: Wallace Cullens scale imaging, color Doppler and duplex ultrasound were performed of bilateral carotid and vertebral arteries in the neck. COMPARISON:  None. FINDINGS: Criteria: Quantification of carotid stenosis is based on velocity parameters that correlate the residual internal carotid diameter with NASCET-based stenosis levels, using the diameter of the distal internal carotid lumen as the denominator for stenosis measurement. The following velocity measurements were obtained: RIGHT ICA:  59/9 cm/sec CCA:  65/13 cm/sec SYSTOLIC ICA/CCA RATIO:  0.9 DIASTOLIC ICA/CCA RATIO:  0.7 ECA:  82 cm/sec LEFT ICA:  46/11 cm/sec CCA:  75/8 cm/sec SYSTOLIC ICA/CCA RATIO:  0.6 DIASTOLIC ICA/CCA RATIO:  1.4 ECA:  62 cm/sec RIGHT CAROTID ARTERY: Partially calcified mixed plaque is present at the level of the carotid bulb and just extends into the ICA origin. Velocities and waveforms are normal. Estimated right ICA stenosis is less than 50%. RIGHT VERTEBRAL ARTERY: Antegrade flow with normal waveform and velocity. LEFT CAROTID ARTERY: Mild amount of calcified plaque is present at the level of the distal bulb and proximal ICA. Velocities and waveforms are normal. Estimated left ICA stenosis is less than 50%. LEFT VERTEBRAL ARTERY: Antegrade flow with normal waveform and velocity. IMPRESSION: Mild amount of plaque at the level of both carotid bulbs and ICA origins. No significant carotid stenosis identified with bilateral estimated ICA stenoses of less than 50%. Electronically Signed   By: Irish Lack M.D.   On: 04/11/2016 09:29   Dg Chest Portable 1 View  Result Date: 04/10/2016 CLINICAL DATA:  LEFT-sided chest pain and back pain EXAM: PORTABLE CHEST 1 VIEW COMPARISON:  02/24/2016  FINDINGS: Normal cardiac silhouette. There is coarsened central bronchovascular markings. Low lung volumes. No focal infiltrate. No pneumothorax. IMPRESSION: Findings suggest bronchitis. Electronically Signed   By: Genevive Bi M.D.   On: 04/10/2016 19:02  "   I personally reviewed the radiologic studies     ED Course: * Patient appears to have a urinary tract infection. Urine culture was added. Her mental status changes most likely secondary to dementia. This was all discussed with the family at the bedside. She was prescribed Macrodantin on an outpatient basis.  the family was advised to reviewed with the primary physician in just to make sure there is no indications for delirium but the episodes of combativeness then with somnolence etc. most likely given the negative MRI and medical workup is secondary to dementia. I felt was unlikely that the urinary tract infection was causing agitation, etc.     Final Clinical Impression:  Lower urinary tract infection Altered mental status likely secondary to dementia Final diagnoses:  None     Plan:  Outpatient " Discharge Medication List as of 04/13/2016  7:28  PM    START taking these medications   Details  nitrofurantoin (MACRODANTIN) 100 MG capsule Take 1 capsule (100 mg total) by mouth 3 (three) times daily., Starting Mon 04/13/2016, Until Mon 04/20/2016, Print      "Patient was transported back via BLS unit Patient was advised to return immediately if condition worsens. Patient was advised to follow up with their primary care physician or other specialized physicians involved in their outpatient care. The patient and/or family member/power of attorney had laboratory results reviewed at the bedside. All questions and concerns were addressed and appropriate discharge instructions were distributed by the nursing staff.             Jennye MoccasinBrian S Katieann Hungate, MD 04/13/16 [phone]

## 2016-04-15 LAB — URINE CULTURE

## 2016-04-16 NOTE — Progress Notes (Signed)
ED Antimicrobial Stewardship Positive Culture Follow Up   Katie Hansen is an 83 y.o. female who [facility] with a chief complaint of  Chief Complaint  Patient presents with  . Fall  . Altered Mental Status    Recent Results (from the past 720 hour(s))  MRSA PCR Screening     Status: None   Collection Time: 04/10/16 10:50 PM  Result Value Ref Range Status   MRSA by PCR NEGATIVE NEGATIVE Final    Comment:        The GeneXpert MRSA Assay (FDA approved for NASAL specimens only), is one component of a comprehensive MRSA colonization surveillance program. It is not intended to diagnose MRSA infection nor to guide or monitor treatment for MRSA infections.   Urine culture     Status: Abnormal   Collection Time: 04/13/16  6:22 PM  Result Value Ref Range Status   Specimen Description URINE, CATHETERIZED  Final   Special Requests NONE  Final   Culture >=100,000 COLONIES/mL STREPTOCOCCUS AGALACTIAE (A)  Final   Report Status 04/15/2016 FINAL  Final    [x]  Treated with Nitrofurantoin, Antibiotic does not cover organism   Called [facility] where patient lives. Spoke with StreetmanBrandy. Gave Urine cx results and will fax copy to them. Brandy will contact MD.  Bari MantisMerrill,Diondre Pulis A 04/16/2016, 4:33 PM

## 2016-08-20 ENCOUNTER — Emergency Department: Payer: Medicare Other

## 2016-08-20 ENCOUNTER — Emergency Department
Admission: EM | Admit: 2016-08-20 | Discharge: 2016-08-20 | Disposition: A | Discharge status: Skilled Nursing Facility | Payer: Medicare Other | Attending: Emergency Medicine | Admitting: Emergency Medicine

## 2016-08-20 ENCOUNTER — Encounter: Payer: Self-pay | Admitting: Emergency Medicine

## 2016-08-20 DIAGNOSIS — Y92129 Unspecified place in nursing home as the place of occurrence of the external cause: Secondary | ICD-10-CM | POA: Insufficient documentation

## 2016-08-20 DIAGNOSIS — W0110XA Fall on same level from slipping, tripping and stumbling with subsequent striking against unspecified object, initial encounter: Secondary | ICD-10-CM | POA: Diagnosis not present

## 2016-08-20 DIAGNOSIS — W19XXXA Unspecified fall, initial encounter: Secondary | ICD-10-CM

## 2016-08-20 DIAGNOSIS — Y939 Activity, unspecified: Secondary | ICD-10-CM | POA: Diagnosis not present

## 2016-08-20 DIAGNOSIS — Y998 Other external cause status: Secondary | ICD-10-CM | POA: Insufficient documentation

## 2016-08-20 DIAGNOSIS — I10 Essential (primary) hypertension: Secondary | ICD-10-CM | POA: Diagnosis not present

## 2016-08-20 DIAGNOSIS — S0990XA Unspecified injury of head, initial encounter: Secondary | ICD-10-CM | POA: Diagnosis not present

## 2016-08-20 DIAGNOSIS — F0390 Unspecified dementia without behavioral disturbance: Secondary | ICD-10-CM | POA: Diagnosis not present

## 2016-08-20 DIAGNOSIS — M25559 Pain in unspecified hip: Secondary | ICD-10-CM | POA: Diagnosis not present

## 2016-08-20 DIAGNOSIS — Z7982 Long term (current) use of aspirin: Secondary | ICD-10-CM | POA: Insufficient documentation

## 2016-08-20 DIAGNOSIS — Z79899 Other long term (current) drug therapy: Secondary | ICD-10-CM | POA: Insufficient documentation

## 2016-08-20 NOTE — Discharge Instructions (Signed)
Continue all medications as directed by your doctor.  Return to the ER for worsening symptoms, persistent vomiting, difficulty breathing or other concerns. °

## 2016-08-20 NOTE — ED Provider Notes (Signed)
[facility] Emergency Department Provider Note   ____________________________________________   First MD Initiated Contact with Patient 08/20/16 [phone]     (approximate)  I have reviewed the triage vital signs and the nursing notes.   HISTORY  Chief Complaint Fall  Limited by dementia  HPI Katie Hansen is a 83 y.o. female brought to the ED from  house via EMS with a chief complaint of unwitnessed fall. Patient has a history of dementia and was found by staff on the floor. Patient denies pain and does not know why she is here. States "sometimes I hit my head". There was some report regarding hip pain but it is unknown which side and patient currently denies hip pain. Denies chest pain, shortness of breath, abdominal pain, nausea, vomiting.   Past Medical History:  Diagnosis Date  . Allergic rhinitis, cause unspecified   . Dementia   . Dyslipidemia    high HDL and LDL  . Hypertension     Patient Active Problem List   Diagnosis Date Noted  . CVA (cerebral vascular accident) (HCC) 04/10/2016  . Toenail fungus 08/13/2014  . Hyperglycemia 02/08/2014  . Osteoporosis, senile   . ALLERGIC RHINITIS 02/25/2010  . Dementia without behavioral disturbance 03/28/2009  . Essential hypertension 03/28/2009    Past Surgical History:  Procedure Laterality Date  . ABDOMINAL HYSTERECTOMY    . APPENDECTOMY      Prior to Admission medications   Medication Sig Start Date End Date Taking? Authorizing Provider  acetaminophen (TYLENOL) 500 MG tablet Take 500 mg by mouth every 4 (four) hours as needed.    [provider]  ALPRAZolam Prudy Feeler(XANAX) 0.25 MG tablet Take 0.25 mg by mouth 3 (three) times daily as needed for anxiety. [id number]    [provider]  alum & mag hydroxide-simeth (MINTOX) 200-200-20 MG/5ML suspension Take 30 mLs by mouth every 6 (six) hours as needed for indigestion or heartburn.    [provider]  aspirin EC  81 MG EC tablet Take 1 tablet (81 mg total) by mouth daily. 04/12/16   Alford HighlandWieting, Richard, MD  azithromycin (ZITHROMAX) 250 MG tablet One tab daily for four days 04/12/16   Alford HighlandWieting, Richard, MD  Cholecalciferol 1000 units tablet Take 2,000 Units by mouth daily.    [provider]  clonazePAM (KLONOPIN) 0.5 MG tablet Take 0.5 mg by mouth 2 (two) times daily.    [provider]  divalproex (DEPAKOTE SPRINKLE) 125 MG capsule Take by mouth 3 (three) times daily. [id number]     [provider]  donepezil (ARICEPT) 10 MG tablet Take 10 mg by mouth at bedtime.    [provider]  fexofenadine (ALLEGRA) 180 MG tablet Take 180 mg by mouth daily.    [provider]  gabapentin (NEURONTIN) 100 MG capsule Take 100 mg by mouth 2 (two) times daily.    [provider]  guaifenesin (ROBITUSSIN) 100 MG/5ML syrup Take 200 mg by mouth every 6 (six) hours as needed for cough.    [provider]  loperamide (IMODIUM) 2 MG capsule Take 2 mg by mouth as needed for diarrhea or loose stools.    [provider]  magnesium hydroxide (MILK OF MAGNESIA) 400 MG/5ML suspension Take 30 mLs by mouth daily as needed for mild constipation.    [provider]  Melatonin 5 MG TABS Take by mouth.    [provider]  oseltamivir (TAMIFLU) 75 MG capsule Take 75 mg by mouth 2 (two) times  daily.    [provider]  QUEtiapine (SEROQUEL) 25 MG tablet Take 50 mg by mouth 2 (two) times daily.    [provider]  sertraline (ZOLOFT) 50 MG tablet Take 50 mg by mouth daily.    [provider]    Allergies Patient has no known allergies.  Family History  Problem Relation Age of Onset  . Colon cancer Mother 20  . Tuberculosis Father     Social History Social History  Substance Use Topics  . Smoking status: Never Smoker  . Smokeless tobacco: Never Used     Comment: Widowed for many years-spouse died in plane crash. 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 moved to [place] indep living [place]) in 08/2008 to be closer to dtr-lives alone  . Alcohol use No    Review of Systems  Constitutional: No fever/chills. Eyes: No visual changes. ENT: No sore throat. Cardiovascular: Denies chest pain. Respiratory: Denies shortness of breath. Gastrointestinal: No abdominal pain.  No nausea, no vomiting.  No diarrhea.  No constipation. Genitourinary: Negative for dysuria. Musculoskeletal: ? Positive for hip pain. Negative for back pain. Skin: Negative for rash. Neurological: Negative for headaches, focal weakness or numbness.   ____________________________________________   PHYSICAL EXAM:  VITAL SIGNS: ED Triage Vitals [08/20/16 0603]  Enc Vitals Group     BP      Pulse      Resp      Temp      Temp src      SpO2      Weight      Height      Head Circumference      Peak Flow      Pain Score 0     Pain Loc      Pain Edu?      Excl. in GC?     Constitutional: Alert and oriented to person. Well appearing and in no acute distress. Eyes: Conjunctivae are normal. PERRL. EOMI. Head: Atraumatic. Nose: No congestion/rhinnorhea. Mouth/Throat: Mucous membranes are moist.  Oropharynx non-erythematous. Neck: No stridor.  No cervical spine tenderness to palpation. No step-offs or deformities noted. Cardiovascular: Normal rate, regular rhythm. Grossly normal heart sounds.  Good peripheral circulation. Respiratory: Normal respiratory effort.  No retractions. Lungs CTAB. Gastrointestinal: Soft and nontender. No distention. No abdominal bruits. No CVA tenderness. Musculoskeletal: Pelvis stable. Neither hip is tender to palpation. Both hips have full range of motion without pain. No lower extremity tenderness nor edema.  No joint effusions. Neurologic:  Normal speech and language. No gross focal neurologic deficits are appreciated.  Skin:  Skin is warm,  dry and intact. No rash noted. Psychiatric: Mood and affect are normal. Speech and behavior are normal.  ____________________________________________   LABS (all labs ordered are listed, but only abnormal results are displayed)  Labs Reviewed - No data to display ____________________________________________  EKG  ED ECG REPORT I, Akia Montalban J, the attending physician, personally viewed and interpreted this ECG.   Date: 08/20/2016  EKG Time: 0628  Rate: 57  Rhythm: sinus bradycardia  Axis: Normal  Intervals:none  ST&T Change: Nonspecific  ____________________________________________  RADIOLOGY  Pending ____________________________________________   PROCEDURES  Procedure(s) performed: None  Procedures  Critical Care performed: No  ____________________________________________   INITIAL IMPRESSION / ASSESSMENT AND PLAN / ED COURSE  Pertinent labs & imaging results that were available during my care of the patient were reviewed by me and considered in my  medical decision making (see chart for details).  83 year old female with dementia who presents status post unwitnessed fall from nursing facility. Review of  MAR demonstrates that patient is not on anticoagulants. Will image head and pelvis.      ____________________________________________   FINAL CLINICAL IMPRESSION(S) / ED DIAGNOSES  Final diagnoses:  Fall, initial encounter      NEW MEDICATIONS STARTED DURING THIS VISIT:  New Prescriptions   No medications on file     Note:  This document was prepared using Dragon voice recognition software and may include unintentional dictation errors.    Irean Hong, MD 08/21/16 [phone]

## 2016-08-20 NOTE — ED Provider Notes (Signed)
Asked to follow up on imaging by Dr. Dolores FrameSung. Pelvis xr and CT head are unremarkable. Patient appropriate for [facility]   Riyanshi Wahab, MD 08/20/16 [phone]

## 2016-08-20 NOTE — ED Notes (Signed)
Attempted to place patient on bed pan. Patient was mean. Refused to pee on bedpan. Patient did not want to pee in diaper. Patient removed off bed pan and placed diaper on patient

## 2016-08-20 NOTE — ED Notes (Signed)
Pt discharged home after verbalizing understanding of discharge instructions; nad noted. 

## 2016-08-20 NOTE — ED Triage Notes (Addendum)
Pt presents to ED via [facility] to be evaluated for unwitnessed fall. Pt denies pain and no Obvious injuries. Pt from [facility].

## 2016-08-27 ENCOUNTER — Emergency Department: Payer: Medicare Other

## 2016-08-27 ENCOUNTER — Emergency Department
Admission: EM | Admit: 2016-08-27 | Discharge: 2016-08-28 | Disposition: A | Discharge status: Skilled Nursing Facility | Payer: Medicare Other | Attending: Emergency Medicine | Admitting: Emergency Medicine

## 2016-08-27 ENCOUNTER — Encounter: Payer: Self-pay | Admitting: Emergency Medicine

## 2016-08-27 DIAGNOSIS — N390 Urinary tract infection, site not specified: Secondary | ICD-10-CM | POA: Diagnosis not present

## 2016-08-27 DIAGNOSIS — F0390 Unspecified dementia without behavioral disturbance: Secondary | ICD-10-CM | POA: Diagnosis not present

## 2016-08-27 DIAGNOSIS — S0101XA Laceration without foreign body of scalp, initial encounter: Secondary | ICD-10-CM | POA: Diagnosis not present

## 2016-08-27 DIAGNOSIS — I10 Essential (primary) hypertension: Secondary | ICD-10-CM | POA: Insufficient documentation

## 2016-08-27 DIAGNOSIS — Y939 Activity, unspecified: Secondary | ICD-10-CM | POA: Insufficient documentation

## 2016-08-27 DIAGNOSIS — I609 Nontraumatic subarachnoid hemorrhage, unspecified: Secondary | ICD-10-CM | POA: Diagnosis not present

## 2016-08-27 DIAGNOSIS — W1830XA Fall on same level, unspecified, initial encounter: Secondary | ICD-10-CM | POA: Insufficient documentation

## 2016-08-27 DIAGNOSIS — Y92129 Unspecified place in nursing home as the place of occurrence of the external cause: Secondary | ICD-10-CM | POA: Diagnosis not present

## 2016-08-27 DIAGNOSIS — Y999 Unspecified external cause status: Secondary | ICD-10-CM | POA: Diagnosis not present

## 2016-08-27 DIAGNOSIS — S0990XA Unspecified injury of head, initial encounter: Secondary | ICD-10-CM | POA: Diagnosis present

## 2016-08-27 DIAGNOSIS — W19XXXA Unspecified fall, initial encounter: Secondary | ICD-10-CM

## 2016-08-27 LAB — URINALYSIS, COMPLETE (UACMP) WITH MICROSCOPIC
BILIRUBIN URINE: NEGATIVE
Glucose, UA: NEGATIVE mg/dL
Hgb urine dipstick: NEGATIVE
KETONES UR: NEGATIVE mg/dL
Nitrite: NEGATIVE
Protein, ur: NEGATIVE mg/dL
Specific Gravity, Urine: 1.011 (ref 1.005–1.030)
pH: 8 (ref 5.0–8.0)

## 2016-08-27 LAB — COMPREHENSIVE METABOLIC PANEL
ALBUMIN: 3.3 g/dL — AB (ref 3.5–5.0)
ALT: 11 U/L — AB (ref 14–54)
ANION GAP: 10 (ref 5–15)
AST: 18 U/L (ref 15–41)
Alkaline Phosphatase: 41 U/L (ref 38–126)
BUN: 15 mg/dL (ref 6–20)
CALCIUM: 9 mg/dL (ref 8.9–10.3)
CHLORIDE: 103 mmol/L (ref 101–111)
CO2: 29 mmol/L (ref 22–32)
Creatinine, Ser: 0.57 mg/dL (ref 0.44–1.00)
GFR calc Af Amer: >60 mL/min (ref >60)
GFR calc non Af Amer: >60 mL/min (ref >60)
GLUCOSE: 122 mg/dL — AB (ref 65–99)
Potassium: 3.8 mmol/L (ref 3.5–5.1)
SODIUM: 142 mmol/L (ref 135–145)
Total Bilirubin: 0.6 mg/dL (ref 0.3–1.2)
Total Protein: 6.2 g/dL — ABNORMAL LOW (ref 6.5–8.1)

## 2016-08-27 LAB — CBC WITH DIFFERENTIAL/PLATELET
BASOS PCT: 1 %
Basophils Absolute: 0.1 10*3/uL (ref 0–0.1)
EOS ABS: 0.2 10*3/uL (ref 0–0.7)
Eosinophils Relative: 2 %
HCT: 35.9 % (ref 35.0–47.0)
HEMOGLOBIN: 11.9 g/dL — AB (ref 12.0–16.0)
Lymphocytes Relative: 13 %
Lymphs Abs: 1.2 10*3/uL (ref 1.0–3.6)
MCH: 29 pg (ref 26.0–34.0)
MCHC: 33.2 g/dL (ref 32.0–36.0)
MCV: 87.1 fL (ref 80.0–100.0)
Monocytes Absolute: 1 10*3/uL — ABNORMAL HIGH (ref 0.2–0.9)
Monocytes Relative: 11 %
NEUTROS PCT: 73 %
Neutro Abs: 7 10*3/uL — ABNORMAL HIGH (ref 1.4–6.5)
Platelets: 324 10*3/uL (ref 150–440)
RBC: 4.13 MIL/uL (ref 3.80–5.20)
RDW: 13.4 % (ref 11.5–14.5)
WBC: 9.4 10*3/uL (ref 3.6–11.0)

## 2016-08-27 LAB — VALPROIC ACID LEVEL: VALPROIC ACID LVL: 45 ug/mL — AB (ref 50.0–100.0)

## 2016-08-27 MED ORDER — CEFTRIAXONE SODIUM 1 G IJ SOLR
1.0000 g | INTRAMUSCULAR | Status: DC
  Administered 2016-08-27: 1 g via INTRAMUSCULAR
  Filled 2016-08-27: qty 10

## 2016-08-27 MED ORDER — CEPHALEXIN 250 MG PO CAPS: 250.0000 mg | ORAL_CAPSULE | Freq: Four times a day (QID) | ORAL | 0 refills | Status: AC

## 2016-08-27 MED ORDER — DEXTROSE 5 % IV SOLN: 1.0000 g | Freq: Once | INTRAVENOUS | Status: DC

## 2016-08-27 NOTE — ED Notes (Signed)
Pt going to CT

## 2016-08-27 NOTE — ED Notes (Signed)
Patient with approximately 1 inch skin tear to left hand with bleeding controlled. Also approximately 1/2  In laceration noted above left eyebrow. Bleeding controlled at this time.

## 2016-08-27 NOTE — ED Provider Notes (Signed)
[facility] Emergency Department Provider Note       Time seen: ----------------------------------------- 4:45 PM on 08/27/2016 -----------------------------------------  Level V caveat: History/ROS limited by dementia   I have reviewed the triage vital signs and the nursing notes.   HISTORY   Chief Complaint Fall and Laceration    HPI Katie Hansen is a 83 y.o. female who presents to the ED for a mechanical fall that occurred while at the nursing home. Patient has a history of severe dementia, no further information is available. She is complaining of some left forehead pain.   Past Medical History:  Diagnosis Date  . Allergic rhinitis, cause unspecified   . Dementia   . Dyslipidemia    high HDL and LDL  . Hypertension     Patient Active Problem List   Diagnosis Date Noted  . CVA (cerebral vascular accident) (HCC) 04/10/2016  . Toenail fungus 08/13/2014  . Hyperglycemia 02/08/2014  . Osteoporosis, senile   . ALLERGIC RHINITIS 02/25/2010  . Dementia without behavioral disturbance 03/28/2009  . Essential hypertension 03/28/2009    Past Surgical History:  Procedure Laterality Date  . ABDOMINAL HYSTERECTOMY    . APPENDECTOMY      Allergies Patient has no known allergies.  Social History Social History  Substance Use Topics  . Smoking status: Never Smoker  . Smokeless tobacco: Never Used     Comment: Widowed for many years-spouse died in plane crash. 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 moved to [place] indep living [facility]([place]) in 08/2008 to be closer to dtr-lives alone  . Alcohol use No    Review of Systems Unknown, positive for forehead laceration  All systems negative/normal/unremarkable/unknown except as stated in the HPI  ____________________________________________   PHYSICAL EXAM:  VITAL SIGNS: ED Triage Vitals  Enc Vitals Group     BP      Pulse       Resp      Temp      Temp src      SpO2      Weight      Height      Head Circumference      Peak Flow      Pain Score      Pain Loc      Pain Edu?      Excl. in GC?     Constitutional: Alert But disoriented, no distress Eyes: Conjunctivae are normal. Normal extraocular movements. ENT   Head: Normocephalic, there is a stellate laceration located over the lateral aspect of the left eyebrow, a second laceration is noted over the left temporal area. Total laceration length is 4 cm   Nose: No congestion/rhinnorhea.   Mouth/Throat: Mucous membranes are moist.   Neck: No stridor. Cardiovascular: Normal rate, regular rhythm. No murmurs, rubs, or gallops. Respiratory: Normal respiratory effort without tachypnea nor retractions. Breath sounds are clear and equal bilaterally. No wheezes/rales/rhonchi. Gastrointestinal: Soft and nontender. Normal bowel sounds Musculoskeletal: Pain with range of motion the right hip Neurologic:  Normal speech and language. No gross focal neurologic deficits are appreciated.  Skin:  Skin is warm, dry with lacerations as dictated above, skin tears noted on the dorsum of left hand Psychiatric: Mood and affect are normal. Speech and behavior are normal.  ____________________________________________  ED COURSE:  Pertinent labs & imaging results that were available during my care of the patient were reviewed by me and considered in my medical  decision making (see chart for details). Patient presents for mechanical fall with lacerations, we will assess with imaging as indicated.   Marland Kitchen.Laceration Repair Date/Time: 08/27/2016 4:49 PM Performed by: Emily Filbert Authorized by: Daryel November E   Consent:    Consent given by:  Patient Anesthesia (see MAR for exact dosages):    Anesthesia method:  None Laceration details:    Location:  Scalp   Scalp location:  Frontal   Length (cm):  4   Depth (mm):  3 Repair type:    Repair type:   Simple Pre-procedure details:    Preparation:  Patient was prepped and draped in usual sterile fashion Exploration:    Contaminated: no   Treatment:    Area cleansed with:  Saline   Amount of cleaning:  Standard   Irrigation solution:  Tap water   Visualized foreign bodies/material removed: no   Skin repair:    Repair method:  Tissue adhesive Approximation:    Approximation:  Close   Vermilion border: well-aligned   Post-procedure details:    Dressing:  Open (no dressing)   Patient tolerance of procedure:  Tolerated well, no immediate complications   ____________________________________________   RADIOLOGY Images were viewed by me  CT head, pelvis x-ray IMPRESSION: Atrophy with small vessel chronic ischemic changes of deep cerebral white matter.  Small focus of acute subarachnoid hemorrhage within a sulcus at the posterior LEFT temporal lobe.  Findings called to Dr. Mayford Knife On 08/27/2016 at 1706 hours.  IMPRESSION: Osseous demineralization without acute bony abnormalities. ____________________________________________  FINAL ASSESSMENT AND PLAN  Fall, head injury, scalp laceration, skin tear  Plan: Patient's imaging were dictated above. Patient had presented for mechanical fall to nursing home. Patient has significant dementia and could not describe the event. She was found to have a subarachnoid hemorrhage on CT. We will plan for transfer to [facility] for observation.   Emily Filbert, MD   Note: This note was generated in part or whole with voice recognition software. Voice recognition is usually quite accurate but there are transcription errors that can and very often do occur. I apologize for any typographical errors that were not detected and corrected.     Emily Filbert, MD 08/27/16 [phone]

## 2016-08-27 NOTE — ED Notes (Signed)
IV site wrapped in bandage and covered with pink tube sock; this to prevent the patient from seeing the IV and prevent it from being pulled out.

## 2016-08-27 NOTE — ED Triage Notes (Signed)
Patient from [facility] after witnessed fall. Patient has laceration above left eyebrow and skin tear on left hand. Patient denies any other pain at this time. Patient oriented at baseline.

## 2016-08-27 NOTE — ED Provider Notes (Addendum)
Discussion was made between neurosurgery and myself which I conveyed to the family. We planned on repeating a CT scan and discharging her if this was negative. However she is found to have significant UTI, I sent a urine culture and she has received IM antibiotics.   Repeat CT scan after 6 hours reveals no interval change in subarachnoid. She'll be discharged with antibiotics and close outpatient follow-up. Katie Hansen, [name], MD 08/27/16 1849    Katie Hansen, [name], MD 08/27/16 [name]1957    Katie Hansen, [name], MD 08/27/16 2325

## 2016-08-27 NOTE — ED Notes (Signed)
Pt assisted to use the bedpan with help another ER RN;

## 2016-08-27 NOTE — ED Notes (Signed)
Pt's family asked that the patient needed to use the bathroom; this RN and ED tech went into the room and attempted to place the patient on a bedpan, patient refused to go on the bedpan and said she did not need to go the bathroom and just wanted to go back to sleep. Pt was covered in briefs and repositioned in the bed and blankets placed to cover her; pt is comfortable and back asleep.

## 2016-08-28 NOTE — ED Notes (Signed)

## 2016-08-28 NOTE — ED Notes (Signed)
[facility] called; report called to Thom ChimesBrenton Robinson

## 2016-08-29 LAB — URINE CULTURE: Special Requests: NORMAL

## 2016-12-03 ENCOUNTER — Encounter: Payer: Self-pay | Admitting: Emergency Medicine

## 2016-12-03 ENCOUNTER — Emergency Department
Admission: EM | Admit: 2016-12-03 | Discharge: 2016-12-04 | Disposition: A | Discharge status: Skilled Nursing Facility | Attending: Emergency Medicine | Admitting: Emergency Medicine

## 2016-12-03 DIAGNOSIS — Z79899 Other long term (current) drug therapy: Secondary | ICD-10-CM | POA: Diagnosis not present

## 2016-12-03 DIAGNOSIS — Y929 Unspecified place or not applicable: Secondary | ICD-10-CM | POA: Insufficient documentation

## 2016-12-03 DIAGNOSIS — Y939 Activity, unspecified: Secondary | ICD-10-CM | POA: Diagnosis not present

## 2016-12-03 DIAGNOSIS — F0390 Unspecified dementia without behavioral disturbance: Secondary | ICD-10-CM | POA: Diagnosis not present

## 2016-12-03 DIAGNOSIS — I10 Essential (primary) hypertension: Secondary | ICD-10-CM | POA: Diagnosis not present

## 2016-12-03 DIAGNOSIS — W19XXXA Unspecified fall, initial encounter: Secondary | ICD-10-CM | POA: Diagnosis not present

## 2016-12-03 DIAGNOSIS — Y999 Unspecified external cause status: Secondary | ICD-10-CM | POA: Diagnosis not present

## 2016-12-03 DIAGNOSIS — S0990XA Unspecified injury of head, initial encounter: Secondary | ICD-10-CM | POA: Insufficient documentation

## 2016-12-03 NOTE — ED Notes (Addendum)
Pt not keeping monitoring on, pt trying repeatedly to get up from bed, bed alarm placed at pt arrival to ED, this nurse responding to each bed alarm.  This nurse attempt to redirect pt w/ little success

## 2016-12-03 NOTE — ED Triage Notes (Addendum)
Pt to rm 12 via EMS from [facility], report witnessed fall from standing, hit back of head w/ hematoma noted, denies LOC.  Pt denies pain.  Per EMS, pt at baseline, pt oriented to self only.  Per EMS, pt w/ hx of brain bleed in June, was placed on hopsice care for dx.

## 2016-12-03 NOTE — ED Notes (Signed)
Activity apron given to pt, tv turned on for distraction

## 2016-12-04 ENCOUNTER — Emergency Department

## 2016-12-04 NOTE — ED Notes (Signed)
Pt unable to sign e-signature due to dementia.  Report called to Brandy at [facility] who verbalizes understanding of discharge and follow up instructions

## 2016-12-04 NOTE — Discharge Instructions (Signed)
Continue medications as directed by your doctor. Return to the ER for worsening symptoms, persistent vomiting, difficulty breathing or other concerns.

## 2016-12-04 NOTE — ED Provider Notes (Signed)
[facility] Emergency Department Provider Note   ____________________________________________   First MD Initiated Contact with Patient 12/03/16 2353     (approximate)  I have reviewed the triage vital signs and the nursing notes.   HISTORY  Chief Complaint Fall  Limited by dementia  HPI Marilena Trevathan is a 83 y.o. female brought to the ED from [facility] status post witnessed fall. Staff reported to EMS that patient had witnessed fall from standing, striking the back of her head. Denied LOC. Staff reports patient is at baseline; oriented to self only. Per MAR, patient not on anticoagulation. There is reported history of "brain bleed" in June for which patient was placed on hospice care.Patient does not recall falling and voices no medical complaints.   Past Medical History:  Diagnosis Date  . Allergic rhinitis, cause unspecified   . Dementia   . Dyslipidemia    high HDL and LDL  . Hypertension     Patient Active Problem List   Diagnosis Date Noted  . CVA (cerebral vascular accident) (HCC) 04/10/2016  . Toenail fungus 08/13/2014  . Hyperglycemia 02/08/2014  . Osteoporosis, senile   . ALLERGIC RHINITIS 02/25/2010  . Dementia without behavioral disturbance 03/28/2009  . Essential hypertension 03/28/2009    Past Surgical History:  Procedure Laterality Date  . ABDOMINAL HYSTERECTOMY    . APPENDECTOMY      Prior to Admission medications   Medication Sig Start Date End Date Taking? Authorizing Provider  acetaminophen (TYLENOL) 500 MG tablet Take 500 mg by mouth every 4 (four) hours as needed.   Yes [provider]  ALPRAZolam (XANAX) 0.25 MG tablet Take 0.25 mg by mouth 3 (three) times daily as needed for anxiety. [id number]   Yes [provider]  alum & mag hydroxide-simeth (MINTOX) 200-200-20 MG/5ML suspension Take 30 mLs by mouth every 6 (six) hours as needed for indigestion or heartburn.   Yes [provider]  aspirin EC 81 MG EC tablet Take 1 tablet (81 mg total) by mouth daily. 04/12/16  Yes Wieting, Richard, MD  Cholecalciferol 1000 units tablet Take 2,000 Units by mouth daily.   Yes [provider]  clonazePAM (KLONOPIN) 0.5 MG tablet Take 0.5 mg by mouth 2 (two) times daily.   Yes [provider]  divalproex (DEPAKOTE SPRINKLE) 125 MG capsule Take by mouth 3 (three) times daily. 0800,2000    Yes [provider]  donepezil (ARICEPT) 10 MG tablet Take 10 mg by mouth at bedtime.   Yes [provider]  fexofenadine (ALLEGRA) 180 MG tablet Take 180 mg by mouth daily.   Yes [provider]  gabapentin (NEURONTIN) 100 MG capsule Take 100 mg by mouth 2 (two) times daily.   Yes [provider]  guaifenesin (ROBITUSSIN) 100 MG/5ML syrup Take 200 mg by mouth every 6 (six) hours as needed for cough.   Yes [provider]  loperamide (IMODIUM) 2 MG capsule Take 2 mg by mouth as needed for diarrhea or loose stools.   Yes [provider]  magnesium hydroxide (MILK OF MAGNESIA) 400 MG/5ML suspension Take 30 mLs by mouth daily as needed for mild constipation.   Yes [provider]  Melatonin 5 MG TABS Take by mouth.   Yes [provider]  QUEtiapine (SEROQUEL) 25 MG tablet Take 50 mg by mouth 2 (two) times daily.   Yes [provider]  sertraline (ZOLOFT) 50 MG tablet Take 50 mg by mouth daily.   Yes  [provider]    Allergies Patient has no known allergies.  Family History  Problem Relation Age of Onset  . Colon cancer Mother 69  . Tuberculosis Father     Social History Social History  Substance Use Topics  . Smoking status: Never Smoker  . Smokeless tobacco: Never Used     Comment: Widowed for many years-spouse died in plane crash. 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 moved to [place] indep living [facility] [facility]) in 08/2008 to be  closer to dtr-lives alone  . Alcohol use No    Review of Systems  Constitutional: No fever/chills. Eyes: No visual changes. ENT: No sore throat. Cardiovascular: Denies chest pain. Respiratory: Denies shortness of breath. Gastrointestinal: No abdominal pain.  No nausea, no vomiting.  No diarrhea.  No constipation. Genitourinary: Negative for dysuria. Musculoskeletal: Negative for back pain. Skin: Negative for rash. Neurological: Negative for headaches, focal weakness or numbness.   ____________________________________________   PHYSICAL EXAM:  VITAL SIGNS: ED Triage Vitals  Enc Vitals Group     BP 12/03/16 2327 (!) 149/84     Pulse Rate 12/03/16 2327 71     Resp 12/03/16 2327 18     Temp 12/03/16 2327 98.2 F (36.8 C)     Temp Source 12/03/16 2327 Oral     SpO2 12/03/16 2327 100 %     Weight 12/03/16 2328 150 lb (68 kg)     Height 12/03/16 2328  (1.626 m)     Head Circumference --      Peak Flow --      Pain Score --      Pain Loc --      Pain Edu? --      Excl. in GC? --     Constitutional: Alert and oriented. Well appearing and in no acute distress. Eyes: Conjunctivae are normal. PERRL. EOMI. Head: Atraumatic. Small hematoma to right parietal scalp without active bleeding. Nose: No congestion/rhinnorhea. Mouth/Throat: Mucous membranes are moist.  Oropharynx non-erythematous. Neck: No stridor.  No cervical spine tenderness to palpation.  No step-offs or deformities noted.  No carotid bruits. Cardiovascular: Normal rate, regular rhythm. Grossly normal heart sounds.  Good peripheral circulation. Respiratory: Normal respiratory effort.  No retractions. Lungs CTAB. Gastrointestinal: Soft and nontender. No distention. No abdominal bruits. No CVA tenderness. Musculoskeletal: No lower extremity tenderness nor edema.  No joint effusions. Neurologic:  Alert and oriented to self. Normal speech and language. No gross focal neurologic deficits are appreciated.  Skin:   Skin is warm, dry and intact. No rash noted. Psychiatric: Mood and affect are normal. Speech and behavior are normal.  ____________________________________________   LABS (all labs ordered are listed, but only abnormal results are displayed)  Labs Reviewed - No data to display ____________________________________________  EKG  none ____________________________________________  RADIOLOGY  Ct Head Wo Contrast  Result Date: 12/04/2016 CLINICAL DATA:  Unwitnessed fall from standing striking head. No loss of consciousness. EXAM: CT HEAD WITHOUT CONTRAST TECHNIQUE: Contiguous axial images were obtained from the base of the skull through the vertex without intravenous contrast. COMPARISON:  Head CT 08/27/2016 FINDINGS: Brain: Resolution of previous left temporal subarachnoid hemorrhage from prior. No intracranial hemorrhage, mass effect, or midline shift. Stable atrophy and chronic small vessel ischemia. No hydrocephalus. The basilar cisterns are patent. No evidence of territorial infarct or acute ischemia. No extra-axial or intracranial fluid collection. Vascular: Atherosclerosis of skullbase vasculature without hyperdense vessel or abnormal calcification.  Skull: No skull fracture.  No focal lesion. Sinuses/Orbits: Paranasal sinuses and mastoid air cells are clear. The visualized orbits are unremarkable. Probable bilateral cataract resection. Other: Right parietal scalp hematoma. IMPRESSION: 1. Right parietal scalp hematoma. No acute intracranial abnormality. No skull fracture. 2. Stable atrophy and chronic small vessel ischemia. Electronically Signed   By: Rubye Oaks M.D.   On: 12/04/2016 00:33    ____________________________________________   PROCEDURES  Procedure(s) performed: None  Procedures  Critical Care performed: No  ____________________________________________   INITIAL IMPRESSION / ASSESSMENT AND PLAN / ED COURSE  Pertinent labs & imaging results that were available  during my care of the patient were reviewed by me and considered in my medical decision making (see chart for details).  83 year old female with dementia who presents with witnessed fall, striking her head. Chart review reveals patient had small focus of subarachnoid blood at the posterior left temporal lobe on 08/27/2016. At that time, she was observed in the emergency department with repeat CT head 6 hours after injury and discharge back to the facility for note increase in bleeding. Will obtain CT head tonight.  Clinical Course as of Dec 05 543  Fri Dec 04, 2016  0037 Updated patient on negative CT results. Strict return precautions given. Patient verbalizes understanding and agrees with plan of care.  [JS]    Clinical Course User Index [JS] Irean Hong, MD     ____________________________________________   FINAL CLINICAL IMPRESSION(S) / ED DIAGNOSES  Final diagnoses:  Fall, initial encounter  Injury of head, initial encounter      NEW MEDICATIONS STARTED DURING THIS VISIT:  Discharge Medication List as of 12/04/2016 12:39 AM       Note:  This document was prepared using Dragon voice recognition software and may include unintentional dictation errors.    Irean Hong, MD 12/04/16 [phone]

## 2016-12-04 NOTE — ED Notes (Signed)
ED secretary to call EMS for transport back to [facility]

## 2016-12-06 ENCOUNTER — Emergency Department

## 2016-12-06 ENCOUNTER — Encounter: Payer: Self-pay | Admitting: Emergency Medicine

## 2016-12-06 ENCOUNTER — Emergency Department
Admission: EM | Admit: 2016-12-06 | Discharge: 2016-12-06 | Disposition: A | Discharge status: Home / Self Care | Attending: Student in an Organized Health Care Education/Training Program | Admitting: Student in an Organized Health Care Education/Training Program

## 2016-12-06 DIAGNOSIS — Y92129 Unspecified place in nursing home as the place of occurrence of the external cause: Secondary | ICD-10-CM | POA: Diagnosis not present

## 2016-12-06 DIAGNOSIS — W1809XA Striking against other object with subsequent fall, initial encounter: Secondary | ICD-10-CM | POA: Diagnosis not present

## 2016-12-06 DIAGNOSIS — S0990XA Unspecified injury of head, initial encounter: Secondary | ICD-10-CM | POA: Diagnosis present

## 2016-12-06 DIAGNOSIS — Z7982 Long term (current) use of aspirin: Secondary | ICD-10-CM | POA: Insufficient documentation

## 2016-12-06 DIAGNOSIS — S0003XA Contusion of scalp, initial encounter: Secondary | ICD-10-CM | POA: Insufficient documentation

## 2016-12-06 DIAGNOSIS — Z79899 Other long term (current) drug therapy: Secondary | ICD-10-CM | POA: Insufficient documentation

## 2016-12-06 DIAGNOSIS — I10 Essential (primary) hypertension: Secondary | ICD-10-CM | POA: Insufficient documentation

## 2016-12-06 DIAGNOSIS — W19XXXA Unspecified fall, initial encounter: Secondary | ICD-10-CM

## 2016-12-06 DIAGNOSIS — Y939 Activity, unspecified: Secondary | ICD-10-CM | POA: Insufficient documentation

## 2016-12-06 DIAGNOSIS — Y999 Unspecified external cause status: Secondary | ICD-10-CM | POA: Insufficient documentation

## 2016-12-06 NOTE — ED Triage Notes (Signed)
Patient presents to Emergency Department via [facility] from [facility] (memory care) with complaints of witnessed fall.  Per EMS via staff pt fell backwards into corner of wall (pt had similar fall 2 days prior).  No LOC or blood thinners .  Pt oriented to self, hospice care pt, pt with old bruise to upper occipital of head reflects no pain on palpation

## 2016-12-06 NOTE — ED Notes (Signed)
Pt DC'd back to [facility] via EMS

## 2016-12-06 NOTE — ED Provider Notes (Signed)
[facility] Emergency Department Provider Note    None    (approximate)  I have reviewed the triage vital signs and the nursing notes.   HISTORY  Chief Complaint Fall  Level V Caveat:  dementia  HPI Katie Hansen is a 83 y.o. female with a history of dementia presents from nursing facility after a witnessed fall where she fell back against the corner of a wall. There was no LOC. No complaint of any pain shortness of breath or chest pain. Did have some contusion to the back of her skull and as the patient has a history of traumatic subarachnoid she was sent to the ER for evaluation.   Past Medical History:  Diagnosis Date  . Allergic rhinitis, cause unspecified   . Dementia   . Dyslipidemia    high HDL and LDL  . Hypertension    Family History  Problem Relation Age of Onset  . Colon cancer Mother 11  . Tuberculosis Father    Past Surgical History:  Procedure Laterality Date  . ABDOMINAL HYSTERECTOMY    . APPENDECTOMY     Patient Active Problem List   Diagnosis Date Noted  . CVA (cerebral vascular accident) (HCC) 04/10/2016  . Toenail fungus 08/13/2014  . Hyperglycemia 02/08/2014  . Osteoporosis, senile   . ALLERGIC RHINITIS 02/25/2010  . Dementia without behavioral disturbance 03/28/2009  . Essential hypertension 03/28/2009      Prior to Admission medications   Medication Sig Start Date End Date Taking? Authorizing Provider  acetaminophen (TYLENOL) 500 MG tablet Take 500 mg by mouth every 4 (four) hours as needed.    [provider]  ALPRAZolam Prudy Feeler) 0.25 MG tablet Take 0.25 mg by mouth 3 (three) times daily as needed for anxiety. [id number]    [provider]  alum & mag hydroxide-simeth (MINTOX) 200-200-20 MG/5ML suspension Take 30 mLs by mouth every 6 (six) hours as needed for indigestion or heartburn.    [provider]  aspirin EC 81 MG EC tablet Take 1 tablet (81 mg total) by mouth daily. 04/12/16    Alford Highland, MD  Cholecalciferol 1000 units tablet Take 2,000 Units by mouth daily.    [provider]  clonazePAM (KLONOPIN) 0.5 MG tablet Take 0.5 mg by mouth 2 (two) times daily.    [provider]  divalproex (DEPAKOTE SPRINKLE) 125 MG capsule Take by mouth 3 (three) times daily. [id number],2956     [provider]  donepezil (ARICEPT) 10 MG tablet Take 10 mg by mouth at bedtime.    [provider]  fexofenadine (ALLEGRA) 180 MG tablet Take 180 mg by mouth daily.    [provider]  gabapentin (NEURONTIN) 100 MG capsule Take 100 mg by mouth 2 (two) times daily.    [provider]  guaifenesin (ROBITUSSIN) 100 MG/5ML syrup Take 200 mg by mouth every 6 (six) hours as needed for cough.    [provider]  loperamide (IMODIUM) 2 MG capsule Take 2 mg by mouth as needed for diarrhea or loose stools.    [provider]  magnesium hydroxide (MILK OF MAGNESIA) 400 MG/5ML suspension Take 30 mLs by mouth daily as needed for mild constipation.    [provider]  Melatonin 5 MG TABS Take by mouth.    [provider]  QUEtiapine (SEROQUEL) 25 MG tablet Take 50 mg by mouth 2 (two) times daily.    [provider]  sertraline (ZOLOFT) 50 MG tablet Take 50  mg by mouth daily.    [provider]    Allergies Patient has no known allergies.    Social History Social History  Substance Use Topics  . Smoking status: Never Smoker  . Smokeless tobacco: Never Used     Comment: Widowed for many years-spouse died in plane crash. 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 moved to [place] indep living [place]) in 08/2008 to be closer to dtr-lives alone  . Alcohol use No    Review of Systems Patient denies headaches, rhinorrhea, blurry vision, numbness, shortness of breath, chest pain, edema, cough, abdominal pain, nausea, vomiting, diarrhea,  dysuria, fevers, rashes or hallucinations unless otherwise stated above in HPI. ____________________________________________   PHYSICAL EXAM:  VITAL SIGNS: Vitals:   12/06/16 2135 12/06/16 2246  BP: (!) 154/54 (!) 148/57  Pulse: (!) 59 64  Resp: 18 18  Temp: 98.1 F (36.7 C)   SpO2: 99% 98%    Constitutional: Alert  in no acute distress. Eyes: Conjunctivae are normal.  Head: small contusion to left parietal scalp, no battle sign or racoon eyes Nose: No congestion/rhinnorhea. Mouth/Throat: Mucous membranes are moist.   Neck: No stridor. Painless ROM.  Cardiovascular: Normal rate, regular rhythm. Grossly normal heart sounds.  Good peripheral circulation. Respiratory: Normal respiratory effort.  No retractions. Lungs CTAB. Gastrointestinal: Soft and nontender. No distention. No abdominal bruits. No CVA tenderness. Musculoskeletal: No lower extremity tenderness nor edema.  No joint effusions. Neurologic:  Normal speech and language. No gross focal neurologic deficits are appreciated. No facial droop Skin:  Skin is warm, dry and intact. No rash noted. Psychiatric: Mood and affect are normal. Speech and behavior are normal.  ____________________________________________   LABS (all labs ordered are listed, but only abnormal results are displayed)  No results found for this or any previous visit (from the past 24 hour(s)). ____________________________________________ ____________________________________________  RADIOLOGY  I personally reviewed all radiographic images ordered to evaluate for the above acute complaints and reviewed radiology reports and findings.  These findings were personally discussed with the patient.  Please see medical record for radiology report.  ____________________________________________   PROCEDURES  Procedure(s) performed:  Procedures    Critical Care performed: no ____________________________________________   INITIAL IMPRESSION /  ASSESSMENT AND PLAN / ED COURSE  Pertinent labs & imaging results that were available during my care of the patient were reviewed by me and considered in my medical decision making (see chart for details).  DDX: iph, contusion, concussion  Katie Hansen is a 83 y.o. who presents to the ED with witnessed fall with minor head injury as described above. No laceration or evidence of skull fracture. CT imaging of the head ordered to evaluate for intracranial injury shows none. Patient in no acute distress. No neck pain. Moving all extremities. Do feel patient is stable and appropriate for discharge back to [facility].  Have discussed with the patient and available family all diagnostics and treatments performed thus far and all questions were answered to the best of my ability. The patient demonstrates understanding and agreement with plan.       ____________________________________________   FINAL CLINICAL IMPRESSION(S) / ED DIAGNOSES  Final diagnoses:  Fall, initial encounter  Contusion of scalp, initial encounter      NEW MEDICATIONS STARTED DURING THIS VISIT:  New Prescriptions   No medications on file     Note:  This document was prepared using Dragon voice recognition software and  may include unintentional dictation errors.    Willy Eddy, MD 12/06/16 2256

## 2017-03-19 ENCOUNTER — Encounter: Payer: Self-pay | Admitting: Emergency Medicine

## 2017-03-19 ENCOUNTER — Emergency Department

## 2017-03-19 ENCOUNTER — Emergency Department
Admission: EM | Admit: 2017-03-19 | Discharge: 2017-03-20 | Disposition: A | Discharge status: Skilled Nursing Facility | Attending: Emergency Medicine | Admitting: Emergency Medicine

## 2017-03-19 DIAGNOSIS — S52502A Unspecified fracture of the lower end of left radius, initial encounter for closed fracture: Secondary | ICD-10-CM | POA: Insufficient documentation

## 2017-03-19 DIAGNOSIS — F0390 Unspecified dementia without behavioral disturbance: Secondary | ICD-10-CM | POA: Diagnosis not present

## 2017-03-19 DIAGNOSIS — Y92129 Unspecified place in nursing home as the place of occurrence of the external cause: Secondary | ICD-10-CM | POA: Insufficient documentation

## 2017-03-19 DIAGNOSIS — Y939 Activity, unspecified: Secondary | ICD-10-CM | POA: Diagnosis not present

## 2017-03-19 DIAGNOSIS — S52615A Nondisplaced fracture of left ulna styloid process, initial encounter for closed fracture: Secondary | ICD-10-CM | POA: Insufficient documentation

## 2017-03-19 DIAGNOSIS — S59912A Unspecified injury of left forearm, initial encounter: Secondary | ICD-10-CM | POA: Diagnosis present

## 2017-03-19 DIAGNOSIS — X58XXXA Exposure to other specified factors, initial encounter: Secondary | ICD-10-CM | POA: Diagnosis not present

## 2017-03-19 DIAGNOSIS — Y999 Unspecified external cause status: Secondary | ICD-10-CM | POA: Insufficient documentation

## 2017-03-19 DIAGNOSIS — I10 Essential (primary) hypertension: Secondary | ICD-10-CM | POA: Diagnosis not present

## 2017-03-19 DIAGNOSIS — Z79899 Other long term (current) drug therapy: Secondary | ICD-10-CM | POA: Diagnosis not present

## 2017-03-19 DIAGNOSIS — Z7982 Long term (current) use of aspirin: Secondary | ICD-10-CM | POA: Diagnosis not present

## 2017-03-19 NOTE — Discharge Instructions (Signed)
Please keep your splint on at all times until you follow-up with the orthopedic surgeon this coming Monday for recheck.  Return to the emergency department sooner for any new or worsening symptoms such as numbness, weakness, worsening pain, or for any other issues whatsoever.  It was a pleasure to take care of you today, and thank you for coming to our emergency department.  If you have any questions or concerns before leaving please ask the nurse to grab me and I'm more than happy to go through your aftercare instructions again.  If you were prescribed any opioid pain medication today such as Norco, Vicodin, Percocet, morphine, hydrocodone, or oxycodone please make sure you do not drive when you are taking this medication as it can alter your ability to drive safely.  If you have any concerns once you are home that you are not improving or are in fact getting worse before you can make it to your follow-up appointment, please do not hesitate to call 911 and come back for further evaluation.  Merrily BrittleNeil Thana Ramp, MD  Results for orders placed or performed during the hospital encounter of 08/27/16  Urine culture  Result Value Ref Range   Specimen Description URINE, CLEAN CATCH    Special Requests Normal    Culture MULTIPLE SPECIES PRESENT, SUGGEST RECOLLECTION (A)    Report Status 08/29/2016 FINAL   CBC with Differential/Platelet  Result Value Ref Range   WBC 9.4 3.6 - 11.0 K/uL   RBC 4.13 3.80 - 5.20 MIL/uL   Hemoglobin 11.9 (L) 12.0 - 16.0 g/dL   HCT 91.435.9 78.235.0 - 95.647.0 %   MCV 87.1 80.0 - 100.0 fL   MCH 29.0 26.0 - 34.0 pg   MCHC 33.2 32.0 - 36.0 g/dL   RDW 21.313.4 08.611.5 - 57.814.5 %   Platelets 324 150 - 440 K/uL   Neutrophils Relative % 73 %   Neutro Abs 7.0 (H) 1.4 - 6.5 K/uL   Lymphocytes Relative 13 %   Lymphs Abs 1.2 1.0 - 3.6 K/uL   Monocytes Relative 11 %   Monocytes Absolute 1.0 (H) 0.2 - 0.9 K/uL   Eosinophils Relative 2 %   Eosinophils Absolute 0.2 0 - 0.7 K/uL   Basophils Relative 1  %   Basophils Absolute 0.1 0 - 0.1 K/uL  Comprehensive metabolic panel  Result Value Ref Range   Sodium 142 135 - 145 mmol/L   Potassium 3.8 3.5 - 5.1 mmol/L   Chloride 103 101 - 111 mmol/L   CO2 29 22 - 32 mmol/L   Glucose, Bld 122 (H) 65 - 99 mg/dL   BUN 15 6 - 20 mg/dL   Creatinine, Ser 4.690.57 0.44 - 1.00 mg/dL   Calcium 9.0 8.9 - 62.910.3 mg/dL   Total Protein 6.2 (L) 6.5 - 8.1 g/dL   Albumin 3.3 (L) 3.5 - 5.0 g/dL   AST 18 15 - 41 U/L   ALT 11 (L) 14 - 54 U/L   Alkaline Phosphatase 41 38 - 126 U/L   Total Bilirubin 0.6 0.3 - 1.2 mg/dL   GFR calc non Af Amer >60 >60 mL/min   GFR calc Af Amer >60 >60 mL/min   Anion gap 10 5 - 15  Urinalysis, Complete w Microscopic  Result Value Ref Range   Color, Urine YELLOW (A) YELLOW   APPearance HAZY (A) CLEAR   Specific Gravity, Urine 1.011 1.005 - 1.030   pH 8.0 5.0 - 8.0   Glucose, UA NEGATIVE NEGATIVE mg/dL  Hgb urine dipstick NEGATIVE NEGATIVE   Bilirubin Urine NEGATIVE NEGATIVE   Ketones, ur NEGATIVE NEGATIVE mg/dL   Protein, ur NEGATIVE NEGATIVE mg/dL   Nitrite NEGATIVE NEGATIVE   Leukocytes, UA LARGE (A) NEGATIVE   RBC / HPF 0-5 0 - 5 RBC/hpf   WBC, UA TOO NUMEROUS TO COUNT 0 - 5 WBC/hpf   Bacteria, UA FEW (A) NONE SEEN   Squamous Epithelial / LPF 0-5 (A) NONE SEEN   Mucus PRESENT    Amorphous Crystal PRESENT    Non Squamous Epithelial 0-5 (A) NONE SEEN  Valproic acid level  Result Value Ref Range   Valproic Acid Lvl 45 (L) 50.0 - 100.0 ug/mL   Dg Wrist Complete Left  Result Date: 03/19/2017 CLINICAL DATA:  Fall.  Left wrist deformity. EXAM: LEFT WRIST - COMPLETE 3+ VIEW COMPARISON:  None. FINDINGS: Distal radial fracture identified involving the metaphysis. Apex anterior angulation noted at the radial fracture site. No definite extension of fracture to the distal articular surface of the radius is visible on this study. Associated ulnar styloid fracture. Degenerative changes are seen in the first carpometacarpal joint.  IMPRESSION: Distal radius fracture with apex anterior angulation and associated ulnar styloid fracture. Electronically Signed   By: Kennith CenterEric  Mansell M.D.   On: 03/19/2017 21:31

## 2017-03-19 NOTE — ED Triage Notes (Signed)
Pt comes into the ED via [facility] from [facility] c/o unwitnessed fall.  Patient has deformity to the left wrist.  Patient has alzheimer's.  Patient is at her baseline according to the facility.  Patient in NAD at this time with even and unlabored respirations.

## 2017-03-20 ENCOUNTER — Encounter: Payer: Self-pay | Admitting: Emergency Medicine

## 2017-03-20 ENCOUNTER — Other Ambulatory Visit: Payer: Self-pay

## 2017-03-20 ENCOUNTER — Emergency Department
Admission: EM | Admit: 2017-03-20 | Discharge: 2017-03-20 | Disposition: A | Discharge status: Skilled Nursing Facility | Attending: Emergency Medicine | Admitting: Emergency Medicine

## 2017-03-20 DIAGNOSIS — M25532 Pain in left wrist: Secondary | ICD-10-CM | POA: Insufficient documentation

## 2017-03-20 DIAGNOSIS — S6992XA Unspecified injury of left wrist, hand and finger(s), initial encounter: Secondary | ICD-10-CM | POA: Diagnosis present

## 2017-03-20 DIAGNOSIS — Y999 Unspecified external cause status: Secondary | ICD-10-CM | POA: Insufficient documentation

## 2017-03-20 DIAGNOSIS — F0390 Unspecified dementia without behavioral disturbance: Secondary | ICD-10-CM | POA: Diagnosis not present

## 2017-03-20 DIAGNOSIS — Z7982 Long term (current) use of aspirin: Secondary | ICD-10-CM | POA: Diagnosis not present

## 2017-03-20 DIAGNOSIS — Z79899 Other long term (current) drug therapy: Secondary | ICD-10-CM | POA: Insufficient documentation

## 2017-03-20 DIAGNOSIS — W19XXXA Unspecified fall, initial encounter: Secondary | ICD-10-CM | POA: Diagnosis not present

## 2017-03-20 DIAGNOSIS — S6292XD Unspecified fracture of left wrist and hand, subsequent encounter for fracture with routine healing: Secondary | ICD-10-CM | POA: Diagnosis not present

## 2017-03-20 DIAGNOSIS — S62102D Fracture of unspecified carpal bone, left wrist, subsequent encounter for fracture with routine healing: Secondary | ICD-10-CM

## 2017-03-20 DIAGNOSIS — I10 Essential (primary) hypertension: Secondary | ICD-10-CM | POA: Insufficient documentation

## 2017-03-20 DIAGNOSIS — Y9389 Activity, other specified: Secondary | ICD-10-CM | POA: Insufficient documentation

## 2017-03-20 DIAGNOSIS — Y92128 Other place in nursing home as the place of occurrence of the external cause: Secondary | ICD-10-CM | POA: Diagnosis not present

## 2017-03-20 NOTE — Discharge Instructions (Signed)
Please keep your follow-up with the orthopedic surgeon as already scheduled and return to the emergency department for any concerns.

## 2017-03-20 NOTE — ED Triage Notes (Signed)
Pt arrives via [facility] from [facility] with c/o staff "wants a cast on wrist". Staff at facility called by CN Raquel and pt will be returned to facility after triage. Pt's splint has been reinforced and pt is in NAD.

## 2017-03-20 NOTE — ED Provider Notes (Signed)
[facility] Emergency Department Provider Note  ____________________________________________   First MD Initiated Contact with Patient 03/20/17 [phone]     (approximate)  I have reviewed the triage vital signs and the nursing notes.   HISTORY  Chief Complaint No chief complaint on file.    HPI Katie Hansen is a 83 y.o. female who comes to the emergency department by EMS after unwrapping her wrist splint.  I am very familiar with the patient as I cared for her earlier today when she had a fall at her nursing home and I diagnosed her with a distal radius fracture along with an ulnar styloid fracture.  She was placed in a left-sided short arm volar splint and given orthopedic follow-up.  According to the nursing home the patient removed the Ace wrap and they sent her to the emergency department requesting a plaster cast.  The patient herself has no complaints at this time aside from gradual onset moderate severity throbbing aching nonradiating pain in her left wrist is worse with movement and improved with rest.  Past Medical History:  Diagnosis Date  . Allergic rhinitis, cause unspecified   . Dementia   . Dyslipidemia    high HDL and LDL  . Hypertension     Patient Active Problem List   Diagnosis Date Noted  . CVA (cerebral vascular accident) (HCC) 04/10/2016  . Toenail fungus 08/13/2014  . Hyperglycemia 02/08/2014  . Osteoporosis, senile   . ALLERGIC RHINITIS 02/25/2010  . Dementia without behavioral disturbance 03/28/2009  . Essential hypertension 03/28/2009    Past Surgical History:  Procedure Laterality Date  . ABDOMINAL HYSTERECTOMY    . APPENDECTOMY      Prior to Admission medications   Medication Sig Start Date End Date Taking? Authorizing Provider  acetaminophen (TYLENOL) 500 MG tablet Take 500 mg by mouth every 4 (four) hours as needed.    [provider]  ALPRAZolam Prudy Feeler(XANAX) 0.25 MG tablet Take 0.25 mg by mouth 3 (three) times  daily as needed for anxiety. [id number],2000    [provider]  alum & mag hydroxide-simeth (MINTOX) 200-200-20 MG/5ML suspension Take 30 mLs by mouth every 6 (six) hours as needed for indigestion or heartburn.    [provider]  aspirin EC 81 MG EC tablet Take 1 tablet (81 mg total) by mouth daily. 04/12/16   Alford HighlandWieting, Richard, MD  Cholecalciferol 1000 units tablet Take 2,000 Units by mouth daily.    [provider]  clonazePAM (KLONOPIN) 0.5 MG tablet Take 0.5 mg by mouth 2 (two) times daily.    [provider]  divalproex (DEPAKOTE SPRINKLE) 125 MG capsule Take by mouth 3 (three) times daily. [id number]     [provider]  donepezil (ARICEPT) 10 MG tablet Take 10 mg by mouth at bedtime.    [provider]  fexofenadine (ALLEGRA) 180 MG tablet Take 180 mg by mouth daily.    [provider]  gabapentin (NEURONTIN) 100 MG capsule Take 100 mg by mouth 2 (two) times daily.    [provider]  guaifenesin (ROBITUSSIN) 100 MG/5ML syrup Take 200 mg by mouth every 6 (six) hours as needed for cough.    [provider]  loperamide (IMODIUM) 2 MG capsule Take 2 mg by mouth as needed for diarrhea or loose stools.    [provider]  magnesium hydroxide (MILK OF MAGNESIA) 400 MG/5ML suspension Take 30 mLs by mouth daily as needed for mild constipation.    [provider]  Melatonin 5 MG TABS Take by mouth.    [provider]  QUEtiapine (SEROQUEL) 25 MG tablet Take 50 mg by mouth 2 (two) times daily.    [provider]  sertraline (ZOLOFT) 50 MG tablet Take 50 mg by mouth daily.    [provider]    Allergies Patient has no known allergies.  Family History  Problem Relation Age of Onset  . Colon cancer Mother 44  . Tuberculosis Father     Social History Social History   Tobacco Use  . Smoking status: Never Smoker  . Smokeless tobacco: Never Used  . Tobacco comment:  Widowed for many years-spouse died in plane crash. 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 moved to [place] indep living [facility]) in 08/2008 to be closer to dtr-lives alone  Substance Use Topics  . Alcohol use: No  . Drug use: No    Review of Systems Constitutional: No fever/chills ENT: No sore throat. Cardiovascular: Denies chest pain. Respiratory: Denies shortness of breath. Gastrointestinal: No abdominal pain.  No nausea, no vomiting.  No diarrhea.  No constipation. Musculoskeletal: Negative for back pain. Neurological: Negative for headaches   ____________________________________________   PHYSICAL EXAM:  VITAL SIGNS: ED Triage Vitals  Enc Vitals Group     BP      Pulse      Resp      Temp      Temp src      SpO2      Weight      Height      Head Circumference      Peak Flow      Pain Score      Pain Loc      Pain Edu?      Excl. in GC?     Constitutional: Pleasant cooperative clearly with dementia Head: Atraumatic. Nose: No congestion/rhinnorhea. Mouth/Throat: No trismus Neck: No stridor.   Cardiovascular: Regular rate and rhythm Respiratory: Normal respiratory effort.  No retractions. MSK: Quite tender over distal radius and distal ulna. No tenderness over snuffbox and no axial load discomfort Sensation intact to light touch over first dorsal webspace, distal index finger, distal small finger Can flex and oppose  thumb, cross 2 on 3, and extend wrist 2+ radial pulse and less than 2 second capillary refill Skin is closed Compartments are soft  Neurologic:  Normal speech and language. No gross focal neurologic deficits are appreciated.  Skin:  Skin is warm, dry and intact. No rash noted.    ____________________________________________  LABS (all labs ordered are listed, but only abnormal results are displayed)  Labs Reviewed - No data to  display   __________________________________________  EKG   ____________________________________________  RADIOLOGY   ____________________________________________   DIFFERENTIAL includes but not limited to  Distal radius fracture, dislocation, distal ulna fracture   PROCEDURES  Procedure(s) performed: no  Procedures  Critical Care performed: no  Observation: no ____________________________________________   INITIAL IMPRESSION / ASSESSMENT AND PLAN / ED COURSE  Pertinent labs & imaging results that were available during my care of the patient were reviewed by me and considered in my medical decision making (see chart for details).  The patient arrives with her volar splint unwrapped.  She remains neurovascularly intact.  I reapplied the volar splint and Ace wrap it once again.  Charge nurse Rachal spoke with the nursing home and they understand they are to keep a closer eye  and that as this is an acute fracture we are unable to provide a plaster cast at this time secondary to anticipated swelling.  The patient is discharged home in stable condition.      ____________________________________________   FINAL CLINICAL IMPRESSION(S) / ED DIAGNOSES  Final diagnoses:  None      NEW MEDICATIONS STARTED DURING THIS VISIT:  This SmartLink is deprecated. Use AVSMEDLIST instead to display the medication list for a patient.   Note:  This document was prepared using Dragon voice recognition software and may include unintentional dictation errors.      Merrily Brittleifenbark, Fatemah Pourciau, MD 03/20/17 [phone]

## 2017-03-20 NOTE — ED Provider Notes (Signed)
[facility] Emergency Department Provider Note  ____________________________________________   First MD Initiated Contact with Patient 03/19/17 2314     (approximate)  I have reviewed the triage vital signs and the nursing notes.   HISTORY  Chief Complaint Fall  Level 5 exemption history limited by the patient's dementia  HPI Katie Hansen is a 83 y.o. female who comes to the emergency department by EMS from her nursing home at today.  She says she does not really remember what happens.  She does have sudden onset severe left wrist pain.  Pain is nonradiating.  It is worse with movement and improved with rest.  No numbness or weakness.  She denies headache neck pain double vision blurred vision chest pain shortness of breath abdominal pain nausea or vomiting.  Past Medical History:  Diagnosis Date  . Allergic rhinitis, cause unspecified   . Dementia   . Dyslipidemia    high HDL and LDL  . Hypertension     Patient Active Problem List   Diagnosis Date Noted  . CVA (cerebral vascular accident) (HCC) 04/10/2016  . Toenail fungus 08/13/2014  . Hyperglycemia 02/08/2014  . Osteoporosis, senile   . ALLERGIC RHINITIS 02/25/2010  . Dementia without behavioral disturbance 03/28/2009  . Essential hypertension 03/28/2009    Past Surgical History:  Procedure Laterality Date  . ABDOMINAL HYSTERECTOMY    . APPENDECTOMY      Prior to Admission medications   Medication Sig Start Date End Date Taking? Authorizing Provider  acetaminophen (TYLENOL) 500 MG tablet Take 500 mg by mouth every 4 (four) hours as needed.    [provider]  ALPRAZolam Prudy Feeler(XANAX) 0.25 MG tablet Take 0.25 mg by mouth 3 (three) times daily as needed for anxiety. [id number]    [provider]  alum & mag hydroxide-simeth (MINTOX) 200-200-20 MG/5ML suspension Take 30 mLs by mouth every 6 (six) hours as needed for indigestion or heartburn.    [provider]    aspirin EC 81 MG EC tablet Take 1 tablet (81 mg total) by mouth daily. 04/12/16   Alford HighlandWieting, Richard, MD  Cholecalciferol 1000 units tablet Take 2,000 Units by mouth daily.    [provider]  clonazePAM (KLONOPIN) 0.5 MG tablet Take 0.5 mg by mouth 2 (two) times daily.    [provider]  divalproex (DEPAKOTE SPRINKLE) 125 MG capsule Take by mouth 3 (three) times daily. [id number]     [provider]  donepezil (ARICEPT) 10 MG tablet Take 10 mg by mouth at bedtime.    [provider]  fexofenadine (ALLEGRA) 180 MG tablet Take 180 mg by mouth daily.    [provider]  gabapentin (NEURONTIN) 100 MG capsule Take 100 mg by mouth 2 (two) times daily.    [provider]  guaifenesin (ROBITUSSIN) 100 MG/5ML syrup Take 200 mg by mouth every 6 (six) hours as needed for cough.    [provider]  loperamide (IMODIUM) 2 MG capsule Take 2 mg by mouth as needed for diarrhea or loose stools.    [provider]  magnesium hydroxide (MILK OF MAGNESIA) 400 MG/5ML suspension Take 30 mLs by mouth daily as needed for mild constipation.    [provider]  Melatonin 5 MG TABS Take by mouth.    [provider]  QUEtiapine (SEROQUEL) 25 MG tablet Take 50 mg by mouth 2 (two) times daily.    [provider]  sertraline (ZOLOFT) 50 MG tablet Take 50 mg  by mouth daily.    [provider]    Allergies Patient has no known allergies.  Family History  Problem Relation Age of Onset  . Colon cancer Mother 75  . Tuberculosis Father     Social History Social History   Tobacco Use  . Smoking status: Never Smoker  . Smokeless tobacco: Never Used  . Tobacco comment: Widowed for many years-spouse died in plane crash. 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 moved to [place] indep living [place] in 08/2008 to be closer to dtr-lives alone  Substance Use  Topics  . Alcohol use: No  . Drug use: No    Review of Systems Level 5 exemption history limited by the patient's dementia ____________________________________________   PHYSICAL EXAM:  VITAL SIGNS: ED Triage Vitals [03/19/17 2044]  Enc Vitals Group     BP (!) 137/53     Pulse Rate 67     Resp 18     Temp 97.9 F (36.6 C)     Temp Source Oral     SpO2 98 %     Weight 165 lb (74.8 kg)     Height 5\' 4"  (1.626 m)     Head Circumference      Peak Flow      Pain Score      Pain Loc      Pain Edu?      Excl. in GC?     Constitutional: Pleasant cooperative well-appearing clearly with significant dementia Eyes: PERRL EOMI. mid range and brisk Head: Atraumatic. Nose: No congestion/rhinnorhea. Mouth/Throat: No trismus Neck: No stridor.  No midline tenderness or step-offs Cardiovascular: Normal rate, regular rhythm. Grossly normal heart sounds.  Good peripheral circulation. Respiratory: Normal respiratory effort.  No retractions. Lungs CTAB and moving good air Gastrointestinal: Soft nontender Musculoskeletal quite tender over distal radius and ulna on the left Sensation intact to light touch over first dorsal webspace, distal index finger, distal small finger Can flex and oppose  thumb, cross 2 on 3, and extend wrist 2+ radial pulse and less than 2 second capillary refill Skin is closed Compartments are soft    Neurologic:  Normal speech and language. No gross focal neurologic deficits are appreciated. Skin:  Skin is warm, dry and intact. No rash noted. Psychiatric: Significant dementia    ____________________________________________   DIFFERENTIAL includes but not limited to  Ulna fracture, radial fracture, scaphoid fracture, radial head fracture, wrist dislocation ____________________________________________   LABS (all labs ordered are listed, but only abnormal results are displayed)  Labs Reviewed - No data to  display   __________________________________________  EKG  ED ECG REPORT I, Merrily Brittle, the attending physician, personally viewed and interpreted this ECG.  Date: 03/19/2017 EKG Time:  Rate: 68 Rhythm: normal sinus rhythm QRS Axis: normal Intervals: normal ST/T Wave abnormalities: normal Narrative Interpretation: no evidence of acute ischemia   ____________________________________________  RADIOLOGY  X-ray reveals left-sided distal radius fracture along with ulnar styloid fracture ____________________________________________   PROCEDURES  Procedure(s) performed: yes  SPLINT APPLICATION Date/Time: 12:22 AM Authorized by: Merrily Brittle Consent: Verbal consent obtained. Risks and benefits: risks, benefits and alternatives were discussed Consent given by: patient Splint applied by: Nurse Lurena Joiner Location details: Left forearm Splint type: Short arm volar Supplies used: Ortho-Glass Post-procedure: The splinted body part was neurovascularly unchanged following the procedure. Patient tolerance: Patient tolerated the procedure well with no immediate complications.      Procedures  Critical  Care performed: no  Observation: no ____________________________________________   INITIAL IMPRESSION / ASSESSMENT AND PLAN / ED COURSE  Pertinent labs & imaging results that were available during my care of the patient were reviewed by me and considered in my medical decision making (see chart for details).  The patient arrives hemodynamically stable quite well-appearing although with significant left distal wrist discomfort.  X-ray confirms a minimally displaced left-sided fracture.  She is splinted and neurologically intact following.  EKG is reassuring with no concerning signs for cardiogenic syncope.  The patient remained on monitor several hours with no ectopy and no arrhythmia.  She has no other signs of trauma.  At this point do not believe she requires further  workup for this fall.  She will be discharged home with orthopedic follow-up.  The patient verbalizes understanding and agree with plan.      ____________________________________________   FINAL CLINICAL IMPRESSION(S) / ED DIAGNOSES  Final diagnoses:  Closed fracture of distal end of left radius, unspecified fracture morphology, initial encounter  Closed nondisplaced fracture of styloid process of left ulna, initial encounter      NEW MEDICATIONS STARTED DURING THIS VISIT:  This SmartLink is deprecated. Use AVSMEDLIST instead to display the medication list for a patient.   Note:  This document was prepared using Dragon voice recognition software and may include unintentional dictation errors.     Merrily Brittleifenbark, Terral Cooks, MD 03/20/17 2154

## 2017-03-20 NOTE — ED Notes (Signed)
Pt unable to sign DC.

## 2017-05-22 ENCOUNTER — Other Ambulatory Visit: Payer: Self-pay

## 2017-05-22 ENCOUNTER — Emergency Department
Admission: EM | Admit: 2017-05-22 | Discharge: 2017-05-22 | Disposition: A | Discharge status: Skilled Nursing Facility | Payer: Medicare Other | Attending: Emergency Medicine | Admitting: Emergency Medicine

## 2017-05-22 ENCOUNTER — Emergency Department: Payer: Medicare Other

## 2017-05-22 DIAGNOSIS — Y9389 Activity, other specified: Secondary | ICD-10-CM | POA: Diagnosis not present

## 2017-05-22 DIAGNOSIS — G8911 Acute pain due to trauma: Secondary | ICD-10-CM | POA: Diagnosis not present

## 2017-05-22 DIAGNOSIS — M25512 Pain in left shoulder: Secondary | ICD-10-CM | POA: Diagnosis not present

## 2017-05-22 DIAGNOSIS — Y92122 Bedroom in nursing home as the place of occurrence of the external cause: Secondary | ICD-10-CM | POA: Insufficient documentation

## 2017-05-22 DIAGNOSIS — F0390 Unspecified dementia without behavioral disturbance: Secondary | ICD-10-CM | POA: Diagnosis not present

## 2017-05-22 DIAGNOSIS — W06XXXA Fall from bed, initial encounter: Secondary | ICD-10-CM | POA: Insufficient documentation

## 2017-05-22 DIAGNOSIS — Z7982 Long term (current) use of aspirin: Secondary | ICD-10-CM | POA: Diagnosis not present

## 2017-05-22 DIAGNOSIS — I10 Essential (primary) hypertension: Secondary | ICD-10-CM | POA: Insufficient documentation

## 2017-05-22 DIAGNOSIS — W19XXXA Unspecified fall, initial encounter: Secondary | ICD-10-CM

## 2017-05-22 DIAGNOSIS — Y999 Unspecified external cause status: Secondary | ICD-10-CM | POA: Insufficient documentation

## 2017-05-22 DIAGNOSIS — Z79899 Other long term (current) drug therapy: Secondary | ICD-10-CM | POA: Insufficient documentation

## 2017-05-22 DIAGNOSIS — S4992XA Unspecified injury of left shoulder and upper arm, initial encounter: Secondary | ICD-10-CM | POA: Diagnosis present

## 2017-05-22 NOTE — ED Provider Notes (Signed)
[facility] Emergency Department Provider Note  ____________________________________________   First MD Initiated Contact with Patient 05/22/17 0202     (approximate)  I have reviewed the triage vital signs and the nursing notes.   HISTORY  Chief Complaint Fall  Level 5 exemption history limited by the patient's dementia  HPI Katie Hansen is a 84 y.o. female who comes to the emergency department via EMS after rolling out of her bed in her nursing home landing on her left shoulder.  The patient herself has no acute complaints at this time saying she is in no pain.  Past Medical History:  Diagnosis Date  . Allergic rhinitis, cause unspecified   . Dementia   . Dyslipidemia    high HDL and LDL  . Hypertension     Patient Active Problem List   Diagnosis Date Noted  . CVA (cerebral vascular accident) (HCC) 04/10/2016  . Toenail fungus 08/13/2014  . Hyperglycemia 02/08/2014  . Osteoporosis, senile   . ALLERGIC RHINITIS 02/25/2010  . Dementia without behavioral disturbance 03/28/2009  . Essential hypertension 03/28/2009    Past Surgical History:  Procedure Laterality Date  . ABDOMINAL HYSTERECTOMY    . APPENDECTOMY      Prior to Admission medications   Medication Sig Start Date End Date Taking? Authorizing Provider  acetaminophen (TYLENOL) 500 MG tablet Take 500 mg by mouth every 4 (four) hours as needed.    [provider]  ALPRAZolam Prudy Feeler) 0.25 MG tablet Take 0.25 mg by mouth 3 (three) times daily as needed for anxiety. [id number]    [provider]  alum & mag hydroxide-simeth (MINTOX) 200-200-20 MG/5ML suspension Take 30 mLs by mouth every 6 (six) hours as needed for indigestion or heartburn.    [provider]  aspirin EC 81 MG EC tablet Take 1 tablet (81 mg total) by mouth daily. 04/12/16   Alford Highland, MD  Cholecalciferol 1000 units tablet Take 2,000 Units by mouth daily.    [provider]    clonazePAM (KLONOPIN) 0.5 MG tablet Take 0.5 mg by mouth 2 (two) times daily.    [provider]  divalproex (DEPAKOTE SPRINKLE) 125 MG capsule Take by mouth 3 (three) times daily. 8119,1478     [provider]  donepezil (ARICEPT) 10 MG tablet Take 10 mg by mouth at bedtime.    [provider]  fexofenadine (ALLEGRA) 180 MG tablet Take 180 mg by mouth daily.    [provider]  gabapentin (NEURONTIN) 100 MG capsule Take 100 mg by mouth 2 (two) times daily.    [provider]  guaifenesin (ROBITUSSIN) 100 MG/5ML syrup Take 200 mg by mouth every 6 (six) hours as needed for cough.    [provider]  loperamide (IMODIUM) 2 MG capsule Take 2 mg by mouth as needed for diarrhea or loose stools.    [provider]  magnesium hydroxide (MILK OF MAGNESIA) 400 MG/5ML suspension Take 30 mLs by mouth daily as needed for mild constipation.    [provider]  Melatonin 5 MG TABS Take by mouth.    [provider]  QUEtiapine (SEROQUEL) 25 MG tablet Take 50 mg by mouth 2 (two) times daily.    [provider]  sertraline (ZOLOFT) 50 MG tablet Take 50 mg by mouth daily.    [provider]    Allergies Patient has no known allergies.  Family History  Problem Relation Age of Onset  . Colon cancer Mother 37  .  Tuberculosis Father     Social History Social History   Tobacco Use  . Smoking status: Never Smoker  . Smokeless tobacco: Never Used  . Tobacco comment: Widowed for many years-spouse died in plane crash. 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 moved to [place] ind[place] living [facility] in 08/2008 to be closer to dtr-lives alone  Substance Use Topics  . Alcohol use: No  . Drug use: No    Review of Systems Level 5 exemption history limited by the patient's dementia ____________________________________________   PHYSICAL  EXAM:  VITAL SIGNS: ED Triage Vitals  Enc Vitals Group     BP      Pulse      Resp      Temp      Temp src      SpO2      Weight      Height      Head Circumference      Peak Flow      Pain Score      Pain Loc      Pain Edu?      Excl. in GC?     Constitutional: Severe dementia.  Appears in no acute distress Eyes: PERRL EOMI. Head: Atraumatic. Nose: No congestion/rhinnorhea. Mouth/Throat: No trismus Neck: No stridor.   Cardiovascular: Normal rate, regular rhythm. Grossly normal heart sounds.  Good peripheral circulation. Respiratory: Normal respiratory effort.  No retractions. Lungs CTAB and moving good air Gastrointestinal: Soft nontender Musculoskeletal: Full range of motion bilateral shoulders and hips.  No bony tenderness appreciated Neurologic: . No gross focal neurologic deficits are appreciated. Skin:  Skin is warm, dry and intact. No rash noted. Psychiatric: Severe dementia    ____________________________________________   DIFFERENTIAL includes but not limited to  Clavicular fracture, shoulder dislocation, shoulder fracture, mechanical fall ____________________________________________   LABS (all labs ordered are listed, but only abnormal results are displayed)  Labs Reviewed - No data to display   __________________________________________  EKG   ____________________________________________  RADIOLOGY  Shoulder x-ray reviewed by me with no acute disease ____________________________________________   PROCEDURES  Procedure(s) performed: no  Procedures  Critical Care performed: no  Observation: no ____________________________________________   INITIAL IMPRESSION / ASSESSMENT AND PLAN / ED COURSE  Pertinent labs & imaging results that were available during my care of the patient were reviewed by me and considered in my medical decision making (see chart for details).  The patient arrives with no acute complaints.  Shoulder x-ray is  unremarkable and she is neurovascularly intact.  Will be discharged back home to her nursing home.      ____________________________________________   FINAL CLINICAL IMPRESSION(S) / ED DIAGNOSES  Final diagnoses:  Fall, initial encounter  Acute pain of left shoulder      NEW MEDICATIONS STARTED DURING THIS VISIT:  Discharge Medication List as of 05/22/2017  2:38 AM       Note:  This document was prepared using Dragon voice recognition software and may include unintentional dictation errors.     Merrily Brittleifenbark, Nakayla Rorabaugh, MD 05/23/17 [phone]

## 2017-05-22 NOTE — ED Triage Notes (Signed)
Pt arrived via EMS from [facility] d/t unwitnessed fall out of bed. Pt was found lying on her left shoulder and c/o left shoulder pain. Pt has hx of dementia.

## 2017-05-22 NOTE — Discharge Instructions (Signed)
It was a pleasure to take care of you today, and thank you for coming to our emergency department.  If you have any questions or concerns before leaving please ask the nurse to grab me and I'm more than happy to go through your aftercare instructions again.  If you were prescribed any opioid pain medication today such as Norco, Vicodin, Percocet, morphine, hydrocodone, or oxycodone please make sure you do not drive when you are taking this medication as it can alter your ability to drive safely.  If you have any concerns once you are home that you are not improving or are in fact getting worse before you can make it to your follow-up appointment, please do not hesitate to call 911 and come back for further evaluation.  Merrily BrittleNeil Elda Dunkerson, MD  Results for orders placed or performed during the hospital encounter of 08/27/16  Urine culture  Result Value Ref Range   Specimen Description URINE, CLEAN CATCH    Special Requests Normal    Culture MULTIPLE SPECIES PRESENT, SUGGEST RECOLLECTION (A)    Report Status 08/29/2016 FINAL   CBC with Differential/Platelet  Result Value Ref Range   WBC 9.4 3.6 - 11.0 K/uL   RBC 4.13 3.80 - 5.20 MIL/uL   Hemoglobin 11.9 (L) 12.0 - 16.0 g/dL   HCT 13.035.9 86.535.0 - 78.447.0 %   MCV 87.1 80.0 - 100.0 fL   MCH 29.0 26.0 - 34.0 pg   MCHC 33.2 32.0 - 36.0 g/dL   RDW 69.613.4 29.511.5 - 28.414.5 %   Platelets 324 150 - 440 K/uL   Neutrophils Relative % 73 %   Neutro Abs 7.0 (H) 1.4 - 6.5 K/uL   Lymphocytes Relative 13 %   Lymphs Abs 1.2 1.0 - 3.6 K/uL   Monocytes Relative 11 %   Monocytes Absolute 1.0 (H) 0.2 - 0.9 K/uL   Eosinophils Relative 2 %   Eosinophils Absolute 0.2 0 - 0.7 K/uL   Basophils Relative 1 %   Basophils Absolute 0.1 0 - 0.1 K/uL  Comprehensive metabolic panel  Result Value Ref Range   Sodium 142 135 - 145 mmol/L   Potassium 3.8 3.5 - 5.1 mmol/L   Chloride 103 101 - 111 mmol/L   CO2 29 22 - 32 mmol/L   Glucose, Bld 122 (H) 65 - 99 mg/dL   BUN 15 6 - 20  mg/dL   Creatinine, Ser 1.320.57 0.44 - 1.00 mg/dL   Calcium 9.0 8.9 - 44.010.3 mg/dL   Total Protein 6.2 (L) 6.5 - 8.1 g/dL   Albumin 3.3 (L) 3.5 - 5.0 g/dL   AST 18 15 - 41 U/L   ALT 11 (L) 14 - 54 U/L   Alkaline Phosphatase 41 38 - 126 U/L   Total Bilirubin 0.6 0.3 - 1.2 mg/dL   GFR calc non Af Amer >60 >60 mL/min   GFR calc Af Amer >60 >60 mL/min   Anion gap 10 5 - 15  Urinalysis, Complete w Microscopic  Result Value Ref Range   Color, Urine YELLOW (A) YELLOW   APPearance HAZY (A) CLEAR   Specific Gravity, Urine 1.011 1.005 - 1.030   pH 8.0 5.0 - 8.0   Glucose, UA NEGATIVE NEGATIVE mg/dL   Hgb urine dipstick NEGATIVE NEGATIVE   Bilirubin Urine NEGATIVE NEGATIVE   Ketones, ur NEGATIVE NEGATIVE mg/dL   Protein, ur NEGATIVE NEGATIVE mg/dL   Nitrite NEGATIVE NEGATIVE   Leukocytes, UA LARGE (A) NEGATIVE   RBC / HPF 0-5 0 - 5  RBC/hpf   WBC, UA TOO NUMEROUS TO COUNT 0 - 5 WBC/hpf   Bacteria, UA FEW (A) NONE SEEN   Squamous Epithelial / LPF 0-5 (A) NONE SEEN   Mucus PRESENT    Amorphous Crystal PRESENT    Non Squamous Epithelial 0-5 (A) NONE SEEN  Valproic acid level  Result Value Ref Range   Valproic Acid Lvl 45 (L) 50.0 - 100.0 ug/mL   Dg Shoulder Left  Result Date: 05/22/2017 CLINICAL DATA:  Left shoulder pain. Patient status post fall out of bed tonight. Initial encounter. EXAM: LEFT SHOULDER - 2+ VIEW COMPARISON:  None. FINDINGS: There is no acute bony or joint abnormality. Mild to moderate acromioclavicular osteoarthritis is noted. Imaged right lung and ribs appear normal. IMPRESSION: No acute abnormality. Mild to moderate acromioclavicular osteoarthritis. Electronically Signed   By: Drusilla Kanner M.D.   On: 05/22/2017 02:34

## 2017-05-23 ENCOUNTER — Emergency Department
Admission: EM | Admit: 2017-05-23 | Discharge: 2017-05-23 | Disposition: A | Discharge status: Home / Self Care | Payer: Medicare Other | Attending: Emergency Medicine | Admitting: Emergency Medicine

## 2017-05-23 ENCOUNTER — Emergency Department: Payer: Medicare Other

## 2017-05-23 ENCOUNTER — Other Ambulatory Visit: Payer: Self-pay

## 2017-05-23 DIAGNOSIS — Z7982 Long term (current) use of aspirin: Secondary | ICD-10-CM | POA: Insufficient documentation

## 2017-05-23 DIAGNOSIS — S0990XA Unspecified injury of head, initial encounter: Secondary | ICD-10-CM | POA: Diagnosis present

## 2017-05-23 DIAGNOSIS — Z79899 Other long term (current) drug therapy: Secondary | ICD-10-CM | POA: Insufficient documentation

## 2017-05-23 DIAGNOSIS — Y92129 Unspecified place in nursing home as the place of occurrence of the external cause: Secondary | ICD-10-CM | POA: Diagnosis not present

## 2017-05-23 DIAGNOSIS — W19XXXA Unspecified fall, initial encounter: Secondary | ICD-10-CM | POA: Diagnosis not present

## 2017-05-23 DIAGNOSIS — S0083XA Contusion of other part of head, initial encounter: Secondary | ICD-10-CM | POA: Diagnosis not present

## 2017-05-23 DIAGNOSIS — N390 Urinary tract infection, site not specified: Secondary | ICD-10-CM | POA: Diagnosis not present

## 2017-05-23 DIAGNOSIS — I10 Essential (primary) hypertension: Secondary | ICD-10-CM | POA: Diagnosis not present

## 2017-05-23 DIAGNOSIS — Y999 Unspecified external cause status: Secondary | ICD-10-CM | POA: Insufficient documentation

## 2017-05-23 DIAGNOSIS — F0390 Unspecified dementia without behavioral disturbance: Secondary | ICD-10-CM | POA: Diagnosis not present

## 2017-05-23 DIAGNOSIS — Y939 Activity, unspecified: Secondary | ICD-10-CM | POA: Diagnosis not present

## 2017-05-23 LAB — BASIC METABOLIC PANEL
Anion gap: 8 (ref 5–15)
BUN: 16 mg/dL (ref 6–20)
CALCIUM: 9.1 mg/dL (ref 8.9–10.3)
CO2: 30 mmol/L (ref 22–32)
Chloride: 105 mmol/L (ref 101–111)
Creatinine, Ser: 0.77 mg/dL (ref 0.44–1.00)
GFR calc non Af Amer: >60 mL/min (ref >60)
Glucose, Bld: 107 mg/dL — ABNORMAL HIGH (ref 65–99)
Potassium: 3.7 mmol/L (ref 3.5–5.1)
Sodium: 143 mmol/L (ref 135–145)

## 2017-05-23 LAB — CBC WITH DIFFERENTIAL/PLATELET
BASOS ABS: 0 10*3/uL (ref 0–0.1)
Basophils Relative: 0 %
EOS ABS: 0 10*3/uL (ref 0–0.7)
EOS PCT: 0 %
HCT: 41.4 % (ref 35.0–47.0)
HEMOGLOBIN: 13 g/dL (ref 12.0–16.0)
LYMPHS PCT: 10 %
Lymphs Abs: 1.1 10*3/uL (ref 1.0–3.6)
MCH: 26.8 pg (ref 26.0–34.0)
MCHC: 31.5 g/dL — ABNORMAL LOW (ref 32.0–36.0)
MCV: 85.1 fL (ref 80.0–100.0)
Monocytes Absolute: 0.9 10*3/uL (ref 0.2–0.9)
Monocytes Relative: 8 %
NEUTROS PCT: 82 %
Neutro Abs: 9 10*3/uL — ABNORMAL HIGH (ref 1.4–6.5)
Platelets: 268 10*3/uL (ref 150–440)
RBC: 4.87 MIL/uL (ref 3.80–5.20)
RDW: 14.9 % — ABNORMAL HIGH (ref 11.5–14.5)
WBC: 11.1 10*3/uL — AB (ref 3.6–11.0)

## 2017-05-23 LAB — URINALYSIS, COMPLETE (UACMP) WITH MICROSCOPIC
Bilirubin Urine: NEGATIVE
Glucose, UA: NEGATIVE mg/dL
Hgb urine dipstick: NEGATIVE
KETONES UR: NEGATIVE mg/dL
Nitrite: NEGATIVE
PH: 7 (ref 5.0–8.0)
Protein, ur: NEGATIVE mg/dL
SPECIFIC GRAVITY, URINE: 1.01 (ref 1.005–1.030)
SQUAMOUS EPITHELIAL / LPF: NONE SEEN

## 2017-05-23 LAB — CK: CK TOTAL: 109 U/L (ref 38–234)

## 2017-05-23 LAB — TROPONIN I

## 2017-05-23 MED ORDER — CEFTRIAXONE SODIUM 1 G IJ SOLR
1.0000 g | Freq: Once | INTRAMUSCULAR | Status: AC
  Administered 2017-05-23: 1 g via INTRAVENOUS
  Filled 2017-05-23: qty 10

## 2017-05-23 MED ORDER — CEPHALEXIN 500 MG PO CAPS: 500.0000 mg | ORAL_CAPSULE | Freq: Two times a day (BID) | ORAL | 0 refills | Status: DC

## 2017-05-23 MED ORDER — LORAZEPAM 2 MG/ML IJ SOLN
0.5000 mg | Freq: Once | INTRAMUSCULAR | Status: AC
  Administered 2017-05-23: 0.5 mg via INTRAVENOUS

## 2017-05-23 MED ORDER — LORAZEPAM 2 MG/ML IJ SOLN
INTRAMUSCULAR | Status: AC
  Administered 2017-05-23: 0.5 mg via INTRAVENOUS
  Filled 2017-05-23: qty 1

## 2017-05-23 NOTE — ED Notes (Signed)
Family decided to take the pt back to the rest home themselves

## 2017-05-23 NOTE — ED Notes (Signed)
C-collar applied to neck until x-ray's complete

## 2017-05-23 NOTE — ED Notes (Signed)
I was called to CT due to pt moving about on stretcher and not holding still, Ativan given, CT complete and pt back to ED room 5

## 2017-05-23 NOTE — ED Triage Notes (Signed)
[facility] from [facility], she was found on the floor this morning with a bloody nose after an unwitnessed fall

## 2017-05-23 NOTE — ED Provider Notes (Signed)
[facility] Emergency Department Provider Note ____________________________________________   I have reviewed the triage vital signs and the triage nursing note.  HISTORY  Chief Complaint Fall   Historian Level 5 Caveat History Limited by poor historian History by EMS who brought him patient from nursing home  HPI Katie Hansen is a 84 y.o. female brought in by EMS from a nursing home, having been found on the floor, unwitnessed fall.  It sounds like she actually had an unwitnessed fall yesterday and was evaluated in the emergency department without traumatic injury, no additional medical workup at that point time.  There is no report of recent medical illness.  She does have dementia no reported change in mental status.  No reported fevers.  Patient herself denies headache or neck pain or chest pain or trouble breathing or abdominal pain, however she is extremely poor historian.  She does have bruising to her face.   Past Medical History:  Diagnosis Date  . Allergic rhinitis, cause unspecified   . Dementia   . Dyslipidemia    high HDL and LDL  . Hypertension     Patient Active Problem List   Diagnosis Date Noted  . CVA (cerebral vascular accident) (HCC) 04/10/2016  . Toenail fungus 08/13/2014  . Hyperglycemia 02/08/2014  . Osteoporosis, senile   . ALLERGIC RHINITIS 02/25/2010  . Dementia without behavioral disturbance 03/28/2009  . Essential hypertension 03/28/2009    Past Surgical History:  Procedure Laterality Date  . ABDOMINAL HYSTERECTOMY    . APPENDECTOMY      Prior to Admission medications   Medication Sig Start Date End Date Taking? Authorizing Provider  acetaminophen (TYLENOL) 500 MG tablet Take 500 mg by mouth every 4 (four) hours as needed.   Yes [provider]  alum & mag hydroxide-simeth (MINTOX) 200-200-20 MG/5ML suspension Take 30 mLs by mouth every 6 (six) hours as needed for indigestion or heartburn.   Yes  [provider]  aspirin EC 81 MG EC tablet Take 1 tablet (81 mg total) by mouth daily. 04/12/16  Yes Wieting, Richard, MD  Cholecalciferol 1000 units tablet Take 2,000 Units by mouth daily.   Yes [provider]  divalproex (DEPAKOTE SPRINKLE) 125 MG capsule Take 250 mg by mouth 2 (two) times daily.    Yes [provider]  docusate sodium (COLACE) 100 MG capsule Take 100 mg by mouth daily.   Yes [provider]  donepezil (ARICEPT) 10 MG tablet Take 10 mg by mouth at bedtime.   Yes [provider]  fexofenadine (ALLEGRA) 180 MG tablet Take 180 mg by mouth daily.   Yes [provider]  fluticasone (FLONASE) 50 MCG/ACT nasal spray Place 1 spray into both nostrils 2 (two) times daily.   Yes [provider]  furosemide (LASIX) 20 MG tablet Take 20 mg by mouth daily.   Yes [provider]  gabapentin (NEURONTIN) 100 MG capsule Take 100 mg by mouth 2 (two) times daily.   Yes [provider]  guaifenesin (ROBITUSSIN) 100 MG/5ML syrup Take 200 mg by mouth every 6 (six) hours as needed for cough.   Yes [provider]  hydrocortisone (ANUSOL-HC) 2.5 % rectal cream Place 1 application rectally 2 (two) times daily as needed for hemorrhoids or anal itching.   Yes [provider]  loperamide (IMODIUM) 2 MG capsule Take 2 mg by mouth as needed for diarrhea or loose stools ((max 8 doses per 24 hours)).    Yes [provider]  LORazepam (ATIVAN) 0.5 MG tablet Take 0.5 mg by mouth 3 (three) times daily.   Yes [provider]  magnesium hydroxide (MILK OF MAGNESIA) 400 MG/5ML suspension Take 30 mLs by mouth at bedtime as needed for mild constipation.    Yes [provider]  Melatonin 5 MG TABS Take 5 mg by mouth at bedtime.    Yes [provider]  neomycin-bacitracin-polymyxin (NEOSPORIN) OINT Apply 1 application topically as directed.   Yes [provider]  NONFORMULARY OR  COMPOUNDED ITEM [LORAZEPAM TOPICAL GEL] - Apply 1ml (1mg ) topically three times daily as needed for agitation   Yes [provider]  OLANZapine (ZYPREXA) 2.5 MG tablet Take 1 tablet (2.5mg ) by mouth every night at bedtime and 1 additional tablet daily as needed for ADLS   Yes [provider]  polyethylene glycol (MIRALAX / GLYCOLAX) packet Take 17 g by mouth daily as needed.   Yes [provider]  potassium chloride (K-DUR,KLOR-CON) 10 MEQ tablet Take 10 mEq by mouth daily.   Yes [provider]  QUEtiapine (SEROQUEL) 50 MG tablet Take 50 mg by mouth 2 (two) times daily.   Yes [provider]  saccharomyces boulardii (FLORASTOR) 250 MG capsule Take 250 mg by mouth daily.   Yes [provider]  senna-docusate (SENOKOT-S) 8.6-50 MG tablet Take 1 tablet by mouth 2 (two) times daily.   Yes [provider]  sertraline (ZOLOFT) 50 MG tablet Take 50 mg by mouth every evening.    Yes [provider]  Skin Protectants, Misc. (EUCERIN) cream Apply 1 application topically every morning.   Yes [provider]  traZODone (DESYREL) 50 MG tablet Take 50 mg by mouth every 6 (six) hours as needed for sleep.   Yes [provider]  ALPRAZolam (XANAX) 0.25 MG tablet Take 0.25 mg by mouth 3 (three) times daily as needed for anxiety. [id number]    [provider]  cephALEXin (KEFLEX) 500 MG capsule Take 1 capsule (500 mg total) by mouth 2 (two) times daily. 05/23/17   Governor Rooks, MD    No Known Allergies  Family History  Problem Relation Age of Onset  . Colon cancer Mother 93  . Tuberculosis Father     Social History Social History   Tobacco Use  . Smoking status: Never Smoker  . Smokeless tobacco: Never Used  . Tobacco comment: Widowed for many years-spouse died in plane crash. 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 moved to [place] indep  living [place]) in 08/2008 to be closer to dtr-lives alone  Substance Use Topics  . Alcohol use: No  . Drug use: No    Review of Systems  Level 5 caveat due to altered mental status/dementia/poor historian However again patient denies headache, neck pain, chest pain, back pain, abdominal pain  ____________________________________________   PHYSICAL EXAM:  VITAL SIGNS: ED Triage Vitals  Enc Vitals Group     BP 05/23/17 0809 (!) 157/67     Pulse Rate 05/23/17 0809 65     Resp 05/23/17 0809 20     Temp 05/23/17 0809 97.9 F (36.6 C)     Temp Source 05/23/17 0809 Oral     SpO2 05/23/17 0809 99 %     Weight 05/23/17 0813 160 lb (72.6 kg)     Height 05/23/17 0813 5\' 5"  (1.651 m)     Head Circumference --      Peak Flow --  Pain Score --      Pain Loc --      Pain Edu? --      Excl. in GC? --      Constitutional: Alert and cooperative, but poor historian. Well appearing and in no distress. HEENT   Head: Normocephalic but has bruising to her head right side frontal and facial about her nose and right eyebrow.      Eyes: Conjunctivae are normal. Pupils equal and round.  Extraocular movements intact without any evidence for impingement.      Ears:         Nose: No congestion/rhinnorhea.   Mouth/Throat: Mucous membranes are moist.   Neck: No stridor.  No step-offs.  Patient denies pain but is a poor historian. Cardiovascular/Chest: Normal rate, regular rhythm.  No murmurs, rubs, or gallops. Respiratory: Normal respiratory effort without tachypnea nor retractions. Breath sounds are clear and equal bilaterally. No wheezes/rales/rhonchi. Gastrointestinal: Soft. No distention, no guarding, no rebound. Nontender.    Genitourinary/rectal:Deferred Musculoskeletal: Pelvis stable and nontender to palpation and range of motion of the hips.  No obvious traumatic injury to the extremities.  Nontender with normal range of motion in all extremities. No joint effusions.  No  lower extremity tenderness.  No edema. Neurologic: No facial droop.  Patient is a poor historian but she does not seem to have any slurred speech or problems following commands.  No gross or focal neurologic deficits are appreciated. Skin:  Skin is warm, dry and intact. No rash noted. Psychiatric: Occasional mild agitation, but she is able to calm down.   ____________________________________________  LABS (pertinent positives/negatives) I, Governor Rooksebecca Kinzlie Harney, MD the attending physician have reviewed the labs noted below.  Labs Reviewed  URINALYSIS, COMPLETE (UACMP) WITH MICROSCOPIC - Abnormal; Notable for the following components:      Result Value   Color, Urine STRAW (*)    APPearance CLEAR (*)    Leukocytes, UA MODERATE (*)    Bacteria, UA FEW (*)    All other components within normal limits  BASIC METABOLIC PANEL - Abnormal; Notable for the following components:   Glucose, Bld 107 (*)    All other components within normal limits  CBC WITH DIFFERENTIAL/PLATELET - Abnormal; Notable for the following components:   WBC 11.1 (*)    MCHC 31.5 (*)    RDW 14.9 (*)    Neutro Abs 9.0 (*)    All other components within normal limits  URINE CULTURE  CULTURE, BLOOD (ROUTINE X 2)  CULTURE, BLOOD (ROUTINE X 2)  TROPONIN I  CK    ____________________________________________    EKG I, Governor Rooksebecca Alaja Goldinger, MD, the attending physician have personally viewed and interpreted all ECGs.  57 bpm.  Normal sinus rhythm.  Narrow QS.  Normal axis.  Nonspecific ST and T wave ____________________________________________  RADIOLOGY  Viewed by me no traumatic injury, viewed through the upper maxillary sinus.  CT head and C-spine without contrast radiologist report:IMPRESSION: No evidence of acute intracranial abnormality. Atrophy with small vessel ischemic changes.  No evidence of traumatic injury to the cervical spine. Mild degenerative changes of the lower cervical  spine. __________________________________________  PROCEDURES  Procedure(s) performed: None  Critical Care performed: None   ____________________________________________  ED COURSE / ASSESSMENT AND PLAN  Pertinent labs & imaging results that were available during my care of the patient were reviewed by me and considered in my medical decision making (see chart for details).   From a trauma perspective, vitals are stable, looks like she  did clearly strike her head and face.  Given that she has dementia and I am not clear that I can clear her C-spine clinically, I did place her in a c-collar and obtain C-spine CT in addition to the head CT as well as maxilla facial CT.  I reviewed her visit from yesterday where she did not have a medical workup since this is a second fall 2 days, I am going to check laboratory studies and urinalysis.  UA consistent with urinary tract infection.  I do not think she is septic.  She is given the first dose of IV Rocephin here.  While she was in CT scan, patient was yelling and moving, making it difficult to get good images.  I did cancel the maxilla facial CT because it appears she probably got adequate visualization of that most area of concern which would have been on the right eyebrow on head CT.  No acute traumatic findings on her scan.  C-collar able to be removed.  Again no concern for sepsis, will discharge with antibiotic Keflex for urinary tract infection.  Blood cultures and urine culture were sent.  DIFFERENTIAL DIAGNOSIS: Including but not limited to traumatic injury to the head face and C-spine as well as rest of the body, as well as medical cause of unwitnessed falls including urinary tract infection, dehydration, electro light disturbance, cardiac issue, etc.  CONSULTATIONS:   None   Patient / Family / Caregiver informed of clinical course, medical decision-making process, and agree with plan.   I discussed return precautions, follow-up  instructions, and discharge instructions with patient and/or family.  Discharge Instructions : You are evaluated after fall and had facial bruising called contusion, no serious traumatic injury suspected.  Your medical evaluation does show sign of urinary tract infection you are given 24-hour IV antibiotic called Rocephin in the emergency room today.  Blood cultures and urine culture have been sent.  Return to emergency department immediately for any worsening condition including confusion altered mental status, fever, vomiting, abdominal pain, or any other symptoms concerning to you.     ___________________________________________   FINAL CLINICAL IMPRESSION(S) / ED DIAGNOSES   Final diagnoses:  Fall, initial encounter  Urinary tract infection without hematuria, site unspecified  Contusion of face, initial encounter      ___________________________________________        Note: This dictation was prepared with Dragon dictation. Any transcriptional errors that result from this process are unintentional    Governor Rooks, MD 05/23/17 1009

## 2017-05-23 NOTE — ED Notes (Signed)
Patient transported to CT 

## 2017-05-23 NOTE — ED Notes (Signed)
Family here to visit, Dr. Shaune PollackLord in to talk with family.

## 2017-05-23 NOTE — Discharge Instructions (Signed)
You are evaluated after fall and had facial bruising called contusion, no serious traumatic injury suspected.  Your medical evaluation does show sign of urinary tract infection you are given 24-hour IV antibiotic called Rocephin in the emergency room today.  Blood cultures and urine culture have been sent.  Return to emergency department immediately for any worsening condition including confusion altered mental status, fever, vomiting, abdominal pain, or any other symptoms concerning to you.

## 2017-05-25 LAB — URINE CULTURE

## 2017-05-28 LAB — CULTURE, BLOOD (ROUTINE X 2)
CULTURE: NO GROWTH
Culture: NO GROWTH
SPECIAL REQUESTS: ADEQUATE
Special Requests: ADEQUATE

## 2017-06-14 ENCOUNTER — Encounter: Payer: Self-pay | Admitting: Emergency Medicine

## 2017-06-14 ENCOUNTER — Emergency Department: Payer: Medicare Other

## 2017-06-14 ENCOUNTER — Emergency Department
Admission: EM | Admit: 2017-06-14 | Discharge: 2017-06-14 | Disposition: A | Discharge status: Home / Self Care | Payer: Medicare Other | Attending: Emergency Medicine | Admitting: Emergency Medicine

## 2017-06-14 DIAGNOSIS — Y998 Other external cause status: Secondary | ICD-10-CM | POA: Insufficient documentation

## 2017-06-14 DIAGNOSIS — T1490XA Injury, unspecified, initial encounter: Secondary | ICD-10-CM

## 2017-06-14 DIAGNOSIS — W010XXA Fall on same level from slipping, tripping and stumbling without subsequent striking against object, initial encounter: Secondary | ICD-10-CM | POA: Insufficient documentation

## 2017-06-14 DIAGNOSIS — Z7982 Long term (current) use of aspirin: Secondary | ICD-10-CM | POA: Insufficient documentation

## 2017-06-14 DIAGNOSIS — S52571A Other intraarticular fracture of lower end of right radius, initial encounter for closed fracture: Secondary | ICD-10-CM | POA: Insufficient documentation

## 2017-06-14 DIAGNOSIS — Y92129 Unspecified place in nursing home as the place of occurrence of the external cause: Secondary | ICD-10-CM | POA: Diagnosis not present

## 2017-06-14 DIAGNOSIS — Y9301 Activity, walking, marching and hiking: Secondary | ICD-10-CM | POA: Insufficient documentation

## 2017-06-14 DIAGNOSIS — S6991XA Unspecified injury of right wrist, hand and finger(s), initial encounter: Secondary | ICD-10-CM | POA: Diagnosis present

## 2017-06-14 DIAGNOSIS — W19XXXA Unspecified fall, initial encounter: Secondary | ICD-10-CM

## 2017-06-14 DIAGNOSIS — Z79899 Other long term (current) drug therapy: Secondary | ICD-10-CM | POA: Diagnosis not present

## 2017-06-14 DIAGNOSIS — F0390 Unspecified dementia without behavioral disturbance: Secondary | ICD-10-CM | POA: Insufficient documentation

## 2017-06-14 DIAGNOSIS — I10 Essential (primary) hypertension: Secondary | ICD-10-CM | POA: Diagnosis not present

## 2017-06-14 MED ORDER — HALOPERIDOL LACTATE 5 MG/ML IJ SOLN
2.0000 mg | Freq: Once | INTRAMUSCULAR | Status: AC
  Administered 2017-06-14: 2 mg via INTRAVENOUS
  Filled 2017-06-14: qty 1

## 2017-06-14 MED ORDER — HYDROCODONE-ACETAMINOPHEN 5-325 MG PO TABS: 1.0000 | ORAL_TABLET | ORAL | 0 refills | Status: AC | PRN

## 2017-06-14 MED ORDER — HYDROCODONE-ACETAMINOPHEN 5-325 MG PO TABS
2.0000 | ORAL_TABLET | Freq: Once | ORAL | Status: AC
  Administered 2017-06-14: 2 via ORAL
  Filled 2017-06-14: qty 2

## 2017-06-14 MED ORDER — MORPHINE SULFATE (PF) 4 MG/ML IV SOLN
4.0000 mg | Freq: Once | INTRAVENOUS | Status: AC
  Administered 2017-06-14: 4 mg via INTRAVENOUS
  Filled 2017-06-14: qty 1

## 2017-06-14 MED ORDER — ONDANSETRON HCL 4 MG/2ML IJ SOLN
4.0000 mg | Freq: Once | INTRAMUSCULAR | Status: AC
  Administered 2017-06-14: 4 mg via INTRAVENOUS
  Filled 2017-06-14: qty 2

## 2017-06-14 NOTE — ED Notes (Signed)
Ems  Called  For  Transport to  [facility]

## 2017-06-14 NOTE — ED Triage Notes (Signed)
Pt arrived via ems from [facility] after mechanical fall that caused her to fall and injure her right wrist. EMS reports deformity. Area is wrapped upon arrival. Pt given 100 mcg of fentanyl in route.

## 2017-06-14 NOTE — Discharge Instructions (Addendum)
Ms. Katie Hansen has a fracture to her right wrist.  We have placed a splint on and she needs to try and keep it clean and dry.  Her daughter is aware of the situation and will help her follow-up with Dr. Ernest PineHooten or 1 of his orthopedic colleagues either later this week or early next week; please call the number provided to schedule an appointment and explained that she has a wrist fracture and needs a follow-up appointment.  I have written a prescription for pain medication and encouraged her to get the pain medicine as written and as needed.  She definitely needs to continue taking her stool softener and this will be even more important if she is taking regular pain medication.  Return to the emergency department if you develop new or worsening symptoms that concern you.

## 2017-06-14 NOTE — ED Provider Notes (Signed)
[facility] Emergency Department Provider Note  ____________________________________________   First MD Initiated Contact with Patient 06/14/17 1506     (approximate)  I have reviewed the triage vital signs and the nursing notes.   HISTORY  Chief Complaint Fall and Wrist Pain  Level 5 caveat:  history/ROS limited by chronic dementia  HPI Katie Hansen is a 84 y.o. female with history of chronic dementia as well as a history of frequent falls.  She presents by EMS today for reportedly having a relatively minor fall backwards onto her outstretched right hand which resulted in obvious right wrist deformity.  She reportedly did not strike her head and she is calm and cooperative at this time and denies any pain in her head, neck, chest, left arm, or either leg.  She does report pain in her right wrist whenever she tries to move her right arm and there is an obvious deformity.  She is unable to provide any additional history.  Paramedics report that the nursing home said that she has had a prior fracture to the right wrist and she did not tolerate well the splint.  Past Medical History:  Diagnosis Date  . Allergic rhinitis, cause unspecified   . Dementia   . Dyslipidemia    high HDL and LDL  . Hypertension     Patient Active Problem List   Diagnosis Date Noted  . CVA (cerebral vascular accident) (HCC) 04/10/2016  . Toenail fungus 08/13/2014  . Hyperglycemia 02/08/2014  . Osteoporosis, senile   . ALLERGIC RHINITIS 02/25/2010  . Dementia without behavioral disturbance 03/28/2009  . Essential hypertension 03/28/2009    Past Surgical History:  Procedure Laterality Date  . ABDOMINAL HYSTERECTOMY    . APPENDECTOMY      Prior to Admission medications   Medication Sig Start Date End Date Taking? Authorizing Provider  acetaminophen (TYLENOL) 500 MG tablet Take 500 mg by mouth 2 (two) times daily. Take 500 mg by mouth twice daily at 8 am and 4 pm. May also  take every 4 hours as needed (not to exceed 2000 mg within 24 hours).   Yes [provider]  alum & mag hydroxide-simeth (MINTOX) 200-200-20 MG/5ML suspension Take 30 mLs by mouth every 6 (six) hours as needed for indigestion or heartburn.   Yes [provider]  aspirin EC 81 MG EC tablet Take 1 tablet (81 mg total) by mouth daily. 04/12/16  Yes Wieting, Richard, MD  cephALEXin (KEFLEX) 500 MG capsule Take 1 capsule (500 mg total) by mouth 2 (two) times daily. 05/23/17  Yes Governor Rooks, MD  divalproex (DEPAKOTE SPRINKLE) 125 MG capsule Take 250 mg by mouth 2 (two) times daily.    Yes [provider]  docusate sodium (COLACE) 100 MG capsule Take 100 mg by mouth daily.   Yes [provider]  furosemide (LASIX) 20 MG tablet Take 20 mg by mouth daily.   Yes [provider]  guaifenesin (ROBITUSSIN) 100 MG/5ML syrup Take 200 mg by mouth every 6 (six) hours as needed for cough.   Yes [provider]  hydrocortisone (ANUSOL-HC) 2.5 % rectal cream Place 1 application rectally 2 (two) times daily as needed for hemorrhoids or anal itching.   Yes [provider]  loperamide (IMODIUM) 2 MG capsule Take 2 mg by mouth as needed for diarrhea or loose stools ((max 8 doses per 24 hours)).    Yes [provider]  LORazepam (ATIVAN) 0.5 MG tablet Take 0.5 mg by  mouth 3 (three) times daily.   Yes [provider]  magnesium hydroxide (MILK OF MAGNESIA) 400 MG/5ML suspension Take 30 mLs by mouth at bedtime as needed for mild constipation.    Yes [provider]  Melatonin 5 MG TABS Take 5 mg by mouth at bedtime.    Yes [provider]  neomycin-bacitracin-polymyxin (NEOSPORIN) OINT Apply 1 application topically as directed.   Yes [provider]  NONFORMULARY OR COMPOUNDED ITEM [LORAZEPAM TOPICAL GEL] - Apply 1ml (1mg ) topically three times daily as needed for agitation   Yes [provider]  polyethylene  glycol (MIRALAX / GLYCOLAX) packet Take 17 g by mouth daily as needed.   Yes [provider]  potassium chloride (K-DUR,KLOR-CON) 10 MEQ tablet Take 10 mEq by mouth daily.   Yes [provider]  senna-docusate (SENOKOT-S) 8.6-50 MG tablet Take 1 tablet by mouth 2 (two) times daily.   Yes [provider]  sertraline (ZOLOFT) 50 MG tablet Take 50 mg by mouth every evening.    Yes [provider]  Skin Protectants, Misc. (EUCERIN) cream Apply 1 application topically every morning.   Yes [provider]  traZODone (DESYREL) 50 MG tablet Take 50 mg by mouth every 6 (six) hours as needed for sleep.   Yes [provider]  HYDROcodone-acetaminophen (NORCO/VICODIN) 5-325 MG tablet Take 1 tablet by mouth every 4 (four) hours as needed for moderate pain or severe pain. 06/14/17   Loleta Leinaala, MD    Allergies Patient has no known allergies.  Family History  Problem Relation Age of Onset  . Colon cancer Mother 60  . Tuberculosis Father     Social History Social History   Tobacco Use  . Smoking status: Never Smoker  . Smokeless tobacco: Never Used  . Tobacco comment: Widowed for many years-spouse died in plane crash. 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 moved to [place] indep living [facility] [place]) in 08/2008 to be closer to dtr-lives alone  Substance Use Topics  . Alcohol use: No  . Drug use: No    Review of Systems Level 5 caveat:  history/ROS limited by chronic dementia.  As documented above, she denies any pain or acute symptoms other than right wrist pain with movement of the right arm. ____________________________________________   PHYSICAL EXAM:  VITAL SIGNS: ED Triage Vitals [06/14/17 1506]  Enc Vitals Group     BP (!) 160/63     Pulse Rate 65     Resp 18     Temp 97.7 F (36.5 C)     Temp Source Oral     SpO2 91 %     Weight 72.6 kg (160 lb)     Height 1.651  m (5\' 5" )     Head Circumference      Peak Flow      Pain Score      Pain Loc      Pain Edu?      Excl. in GC?     Constitutional: Alert and responds to simple questions, oriented only to self.  No acute distress unless she moves her right arm. Eyes: Conjunctivae are normal.  Head: Atraumatic. Nose: No congestion/rhinnorhea. Mouth/Throat: Mucous membranes are moist. Neck: No stridor.  No meningeal signs.  No cervical spine tenderness to palpation. Cardiovascular: Normal rate, regular rhythm. Good peripheral circulation. Grossly normal heart sounds. Respiratory: Normal respiratory effort.  No retractions. Lungs CTAB. Gastrointestinal: Soft and nontender.  No distention.  Musculoskeletal: Obvious deformity of right wrist.  Patient is able to move all of her fingers but doing so causes pain.  Normal capillary refill in the affected hand.  No significant swelling, soft compartments. Neurologic:  Normal speech and language. No gross focal neurologic deficits are appreciated.  She is able to feel light touch to all of her fingers in the back of her right hand in the palm of her right hand. Skin:  Skin is warm, dry and intact. No rash noted.   ____________________________________________   LABS (all labs ordered are listed, but only abnormal results are displayed)  Labs Reviewed - No data to display ____________________________________________  EKG  ED ECG REPORT I, Loleta Roseory Elantra Caprara, the attending physician, personally viewed and interpreted this ECG.  Date: 06/14/2017 EKG Time: 15: 58 Rate: 68 Rhythm: normal sinus rhythm QRS Axis: normal Intervals: normal ST/T Wave abnormalities: Non-specific ST segment / T-wave changes, but no evidence of acute ischemia. Narrative Interpretation: no evidence of acute ischemia  ____________________________________________  RADIOLOGY I, Loleta Roseory Alithea Lapage, personally viewed and evaluated these images (plain radiographs) as part of my medical decision  making, as well as reviewing the written report by the radiologist.  ED MD interpretation: Acute impacted fracture of distal radius and ulnar styloid  Official radiology report(s): Dg Wrist 2 Views Right  Result Date: 06/14/2017 CLINICAL DATA:  84 y/o  F; fall with wrist injury. EXAM: RIGHT WRIST - 2 VIEW COMPARISON:  None. FINDINGS: Acute impacted intra-articular fracture of the distal radius with 1/2 shaft's width posterior and radial displacement of the distal fracture component and volar apex angulation. Mildly displaced fracture ulnar styloid. No radiocarpal dislocation. IMPRESSION: 1. Acute impacted intra-articular displaced and volar apex angulated fracture distal radius. 2. Acute mildly displaced fracture ulnar styloid. Electronically Signed   By: Mitzi HansenLance  Furusawa-Stratton M.D.   On: 06/14/2017 15:46    ____________________________________________   PROCEDURES  Critical Care performed: No   Procedure(s) performed:   .Ortho Injury Treatment Date/Time: 06/14/2017 7:56 PM Performed by: Loleta RoseForbach, Christe Tellez, MD Authorized by: Loleta RoseForbach, Tyona Nilsen, MD   Consent:    Consent obtained:  Verbal   Consent given by:  Guardian   Risks discussed:  Fracture   Alternatives discussed:  No treatmentInjury location: wrist Location details: right wrist Injury type: fracture Fracture type: distal radius and ulnar styloid Pre-procedure neurovascular assessment: neurovascularly intact Pre-procedure distal perfusion: normal Pre-procedure neurological function: normal Pre-procedure range of motion: reduced  Anesthesia: Local anesthesia used: no  Patient sedated: NoManipulation performed: no Immobilization: splint Splint type: sugar tong Supplies used: Ortho-Glass Post-procedure neurovascular assessment: post-procedure neurovascularly intact Post-procedure distal perfusion: normal Post-procedure neurological function: normal Patient tolerance: Patient tolerated the procedure well with no immediate  complications      ____________________________________________   INITIAL IMPRESSION / ASSESSMENT AND PLAN / ED COURSE  As part of my medical decision making, I reviewed the following data within the electronic MEDICAL RECORD NUMBER History obtained from family, Nursing notes reviewed and incorporated, EKG interpreted , Old chart reviewed, Radiograph reviewed  and Notes from prior ED visits    Differential diagnosis includes, but is not limited to, wrist fracture or dislocation, neurovascular injury, sprain.  Because of the fall seems pretty clearly to have been a mechanical fall like her prior falls and she has no other symptoms at this time.  Will evaluate with radiographs to determine the best way to proceed with treatment of her wrist.  Clinical Course as of Jun 15 1955  Mon Jun 14, 2017  1603 I called and spoke by phone with the OR nurse for Dr. Ernest Pine because he is in the middle of a surgery.  I provided the name and number of the patient so he can review the radiographs and get back to me with recommendations regarding splint versus reduction and splint or possible operative treatment.   [CF]  [id number] I spoke by phone with Dr. Ernest Pine who personally reviewed the radiographs.  He feels that a sugar tong splint is appropriate.  I have reached out to the patient's daughter who is her emergency contact in an attempt to discuss longer-term management, such as whether or not she would be an appropriate operative candidate, and I left a message for the daughter to call me back.  Regardless Dr. Ernest Pine feels that splinting and outpatient follow-up is appropriate for today.   [CF]  1656 I spoke by phone with the patient's daughter who is her decision maker.  She said that she would be open to the possibility of surgery if Dr. Ernest Pine recommends it for the patient's comfort.  They can discuss this in the outpatient follow-up visit.  We are placing a sugar tong splint and I will provide a prescription for  pain medication.  According to the prescription drug database, the patient does not regularly get prescriptions for narcotics.   [CF]  1956 The patient required some additional medication including Haldol 2 mg IV and I gave her 2 Percocet prior to her discharge.  Her sugar tong splint was well placed but she is very displeased with it being on and I think she will require frequent redirection.  She did not require any other acute intervention at this time.   [CF]    Clinical Course User Index [CF] Loleta Fayola, MD    ____________________________________________  FINAL CLINICAL IMPRESSION(S) / ED DIAGNOSES  Final diagnoses:  Other closed intra-articular fracture of distal end of right radius, initial encounter  Fall, initial encounter     MEDICATIONS GIVEN DURING THIS VISIT:  Medications  morphine 4 MG/ML injection 4 mg (4 mg Intravenous Given 06/14/17 1603)  ondansetron (ZOFRAN) injection 4 mg (4 mg Intravenous Given 06/14/17 1600)  HYDROcodone-acetaminophen (NORCO/VICODIN) 5-325 MG per tablet 2 tablet (2 tablets Oral Given 06/14/17 1806)  haloperidol lactate (HALDOL) injection 2 mg (2 mg Intravenous Given 06/14/17 1806)     ED Discharge Orders        Ordered    HYDROcodone-acetaminophen (NORCO/VICODIN) 5-325 MG tablet  Every 4 hours PRN     06/14/17 1706       Note:  This document was prepared using Dragon voice recognition software and may include unintentional dictation errors.    Loleta Leanndra, MD 06/14/17 [phone]

## 2017-06-24 ENCOUNTER — Emergency Department
Admission: EM | Admit: 2017-06-24 | Discharge: 2017-06-25 | Disposition: A | Discharge status: Home / Self Care | Payer: Medicare Other | Attending: Emergency Medicine | Admitting: Emergency Medicine

## 2017-06-24 ENCOUNTER — Other Ambulatory Visit: Payer: Self-pay

## 2017-06-24 ENCOUNTER — Emergency Department: Payer: Medicare Other

## 2017-06-24 DIAGNOSIS — N3001 Acute cystitis with hematuria: Secondary | ICD-10-CM

## 2017-06-24 DIAGNOSIS — Y929 Unspecified place or not applicable: Secondary | ICD-10-CM | POA: Insufficient documentation

## 2017-06-24 DIAGNOSIS — I10 Essential (primary) hypertension: Secondary | ICD-10-CM | POA: Insufficient documentation

## 2017-06-24 DIAGNOSIS — Z9181 History of falling: Secondary | ICD-10-CM | POA: Insufficient documentation

## 2017-06-24 DIAGNOSIS — S0990XA Unspecified injury of head, initial encounter: Secondary | ICD-10-CM | POA: Diagnosis present

## 2017-06-24 DIAGNOSIS — Z79899 Other long term (current) drug therapy: Secondary | ICD-10-CM | POA: Insufficient documentation

## 2017-06-24 DIAGNOSIS — Z8673 Personal history of transient ischemic attack (TIA), and cerebral infarction without residual deficits: Secondary | ICD-10-CM | POA: Insufficient documentation

## 2017-06-24 DIAGNOSIS — W19XXXA Unspecified fall, initial encounter: Secondary | ICD-10-CM | POA: Insufficient documentation

## 2017-06-24 DIAGNOSIS — Y999 Unspecified external cause status: Secondary | ICD-10-CM | POA: Diagnosis not present

## 2017-06-24 DIAGNOSIS — F0390 Unspecified dementia without behavioral disturbance: Secondary | ICD-10-CM | POA: Diagnosis not present

## 2017-06-24 DIAGNOSIS — E870 Hyperosmolality and hypernatremia: Secondary | ICD-10-CM | POA: Insufficient documentation

## 2017-06-24 DIAGNOSIS — E860 Dehydration: Secondary | ICD-10-CM | POA: Diagnosis not present

## 2017-06-24 DIAGNOSIS — Y939 Activity, unspecified: Secondary | ICD-10-CM | POA: Diagnosis not present

## 2017-06-24 LAB — CBC
HCT: 40.6 % (ref 35.0–47.0)
Hemoglobin: 12.9 g/dL (ref 12.0–16.0)
MCH: 27.6 pg (ref 26.0–34.0)
MCHC: 31.8 g/dL — AB (ref 32.0–36.0)
MCV: 86.7 fL (ref 80.0–100.0)
PLATELETS: 308 10*3/uL (ref 150–440)
RBC: 4.68 MIL/uL (ref 3.80–5.20)
RDW: 14.9 % — AB (ref 11.5–14.5)
WBC: 8.6 10*3/uL (ref 3.6–11.0)

## 2017-06-24 LAB — URINALYSIS, COMPLETE (UACMP) WITH MICROSCOPIC
BILIRUBIN URINE: NEGATIVE
GLUCOSE, UA: NEGATIVE mg/dL
HGB URINE DIPSTICK: NEGATIVE
KETONES UR: 20 mg/dL — AB
NITRITE: POSITIVE — AB
PH: 7 (ref 5.0–8.0)
Protein, ur: 30 mg/dL — AB
SPECIFIC GRAVITY, URINE: 1.023 (ref 1.005–1.030)

## 2017-06-24 LAB — BASIC METABOLIC PANEL
Anion gap: 11 (ref 5–15)
BUN: 38 mg/dL — AB (ref 6–20)
CO2: 31 mmol/L (ref 22–32)
CREATININE: 0.76 mg/dL (ref 0.44–1.00)
Calcium: 9.3 mg/dL (ref 8.9–10.3)
Chloride: 107 mmol/L (ref 101–111)
GFR calc Af Amer: >60 mL/min (ref >60)
GLUCOSE: 100 mg/dL — AB (ref 65–99)
POTASSIUM: 3.6 mmol/L (ref 3.5–5.1)
Sodium: 149 mmol/L — ABNORMAL HIGH (ref 135–145)

## 2017-06-24 LAB — SODIUM: Sodium: 147 mmol/L — ABNORMAL HIGH (ref 135–145)

## 2017-06-24 MED ORDER — CEPHALEXIN 500 MG PO CAPS: 500.0000 mg | ORAL_CAPSULE | Freq: Three times a day (TID) | ORAL | 0 refills | Status: AC

## 2017-06-24 MED ORDER — CEPHALEXIN 500 MG PO CAPS
500.0000 mg | ORAL_CAPSULE | Freq: Once | ORAL | Status: DC
  Filled 2017-06-24: qty 1

## 2017-06-24 MED ORDER — SODIUM CHLORIDE 0.9 % IV SOLN
1.0000 g | Freq: Once | INTRAVENOUS | Status: AC
  Administered 2017-06-24: 1 g via INTRAVENOUS
  Filled 2017-06-24: qty 10

## 2017-06-24 MED ORDER — SODIUM CHLORIDE 0.9 % IV BOLUS
1000.0000 mL | Freq: Once | INTRAVENOUS | Status: AC
  Administered 2017-06-24: 1000 mL via INTRAVENOUS

## 2017-06-24 NOTE — ED Provider Notes (Signed)
[facility] Emergency Department Provider Note  ____________________________________________  Time seen: Approximately 4:58 PM  I have reviewed the triage vital signs and the nursing notes.   HISTORY  Chief Complaint Fall and Head Injury  Level 5 caveat:  Portions of the history and physical were unable to be obtained due to dementia   HPI Katie Hansen is a 84 y.o. female for history of advanced dementia, frequent falls, hypertension, hyperlipidemia, CVA who presents for a unwitnessed fall.  Patient arrives alert and oriented x1 which is her baseline.  Has no recollection of the fall.  Staff reported no LOC. She is not on blood thinners. Patient denies any pain other than when her R arm is moved which is in cast from a recent fracture   Past Medical History:  Diagnosis Date  . Allergic rhinitis, cause unspecified   . Dementia   . Dyslipidemia    high HDL and LDL  . Hypertension     Patient Active Problem List   Diagnosis Date Noted  . CVA (cerebral vascular accident) (HCC) 04/10/2016  . Toenail fungus 08/13/2014  . Hyperglycemia 02/08/2014  . Osteoporosis, senile   . ALLERGIC RHINITIS 02/25/2010  . Dementia without behavioral disturbance 03/28/2009  . Essential hypertension 03/28/2009    Past Surgical History:  Procedure Laterality Date  . ABDOMINAL HYSTERECTOMY    . APPENDECTOMY      Prior to Admission medications   Medication Sig Start Date End Date Taking? Authorizing Provider  acetaminophen (TYLENOL) 500 MG tablet Take 500 mg by mouth 2 (two) times daily. Take 500 mg by mouth twice daily at 8 am and 4 pm. May also take every 4 hours as needed (not to exceed 2000 mg within 24 hours).    [provider]  alum & mag hydroxide-simeth (MINTOX) 200-200-20 MG/5ML suspension Take 30 mLs by mouth every 6 (six) hours as needed for indigestion or heartburn.    [provider]  aspirin EC 81 MG EC tablet Take 1 tablet (81 mg  total) by mouth daily. 04/12/16   Alford HighlandWieting, Richard, MD  cephALEXin (KEFLEX) 500 MG capsule Take 1 capsule (500 mg total) by mouth 3 (three) times daily for 7 days. 06/24/17 07/01/17  Nita SickleVeronese, [place], MD  divalproex (DEPAKOTE SPRINKLE) 125 MG capsule Take 250 mg by mouth 2 (two) times daily.     [provider]  docusate sodium (COLACE) 100 MG capsule Take 100 mg by mouth daily.    [provider]  furosemide (LASIX) 20 MG tablet Take 20 mg by mouth daily.    [provider]  guaifenesin (ROBITUSSIN) 100 MG/5ML syrup Take 200 mg by mouth every 6 (six) hours as needed for cough.    [provider]  HYDROcodone-acetaminophen (NORCO/VICODIN) 5-325 MG tablet Take 1 tablet by mouth every 4 (four) hours as needed for moderate pain or severe pain. 06/14/17   Loleta RoseForbach, Cory, MD  hydrocortisone (ANUSOL-HC) 2.5 % rectal cream Place 1 application rectally 2 (two) times daily as needed for hemorrhoids or anal itching.    [provider]  loperamide (IMODIUM) 2 MG capsule Take 2 mg by mouth as needed for diarrhea or loose stools ((max 8 doses per 24 hours)).     [provider]  LORazepam (ATIVAN) 0.5 MG tablet Take 0.5 mg by mouth 3 (three) times daily.    [provider]  magnesium hydroxide (MILK OF MAGNESIA) 400 MG/5ML suspension Take 30 mLs by mouth at bedtime as needed for  mild constipation.     [provider]  Melatonin 5 MG TABS Take 5 mg by mouth at bedtime.     [provider]  neomycin-bacitracin-polymyxin (NEOSPORIN) OINT Apply 1 application topically as directed.    [provider]  NONFORMULARY OR COMPOUNDED ITEM [LORAZEPAM TOPICAL GEL] - Apply 1ml (1mg ) topically three times daily as needed for agitation    [provider]  polyethylene glycol (MIRALAX / GLYCOLAX) packet Take 17 g by mouth daily as needed.    [provider]  potassium chloride (K-DUR,KLOR-CON) 10 MEQ tablet Take 10 mEq by mouth  daily.    [provider]  senna-docusate (SENOKOT-S) 8.6-50 MG tablet Take 1 tablet by mouth 2 (two) times daily.    [provider]  sertraline (ZOLOFT) 50 MG tablet Take 50 mg by mouth every evening.     [provider]  Skin Protectants, Misc. (EUCERIN) cream Apply 1 application topically every morning.    [provider]  traZODone (DESYREL) 50 MG tablet Take 50 mg by mouth every 6 (six) hours as needed for sleep.    [provider]    Allergies Patient has no known allergies.  Family History  Problem Relation Age of Onset  . Colon cancer Mother 68  . Tuberculosis Father     Social History Social History   Tobacco Use  . Smoking status: Never Smoker  . Smokeless tobacco: Never Used  . Tobacco comment: Widowed for many years-spouse died in plane crash. 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 moved to [place] indep living [place]) in 08/2008 to be closer to dtr-lives alone  Substance Use Topics  . Alcohol use: No  . Drug use: No    Review of Systems Constitutional: Negative for fever. ENT: Negative for facial injury or neck injury Cardiovascular: Negative for chest injury. Respiratory: Negative for shortness of breath. Negative for chest wall injury. Gastrointestinal: Negative for abdominal injury. Musculoskeletal: Negative for back injury, negative for acute arm or leg pain. Skin: Negative for laceration/abrasions.  Level 5 caveat:  Portions of the history and physical were unable to be obtained due to dementia    ____________________________________________   PHYSICAL EXAM:  VITAL SIGNS: ED Triage Vitals  Enc Vitals Group     BP 06/24/17 1642 (!) 167/68     Pulse Rate 06/24/17 1642 64     Resp 06/24/17 1642 (!) 22     Temp 06/24/17 1642 97.9 F (36.6 C)     Temp Source 06/24/17 1642 Axillary     SpO2 06/24/17 1642 100 %     Weight 06/24/17 1646  160 lb (72.6 kg)     Height 06/24/17 1646 5\' 5"  (1.651 m)     Head Circumference --      Peak Flow --      Pain Score --      Pain Loc --      Pain Edu? --      Excl. in GC? --    Full spinal precautions maintained throughout the trauma exam. Constitutional: Alert and oriented x 1. No acute distress. Does not appear intoxicated. HEENT Head: Normocephalic and atraumatic. Face: No facial bony tenderness. Stable midface. Patient with old healing bruising on the R side of her face Ears: No hemotympanum bilaterally. No Battle sign Eyes: No eye injury. PERRL. No raccoon eyes Nose: Nontender. No epistaxis. No rhinorrhea Mouth/Throat: Mucous membranes are moist. No oropharyngeal blood.  No dental injury. Airway patent without stridor. Normal voice. Neck: no C-collar in place. No midline c-spine tenderness.  Cardiovascular: Normal rate, regular rhythm. Normal and symmetric distal pulses are present in all extremities. Pulmonary/Chest: Chest wall is stable and nontender to palpation/compression. Normal respiratory effort. Breath sounds are normal. No crepitus.  Abdominal: Soft, nontender, non distended. Musculoskeletal: Cast intact on the R forearm with normal brisk refill in the finger, strong radial pulse, no cyanosis/ swelling/ or discoloration.  Nontender with normal full range of motion in all other extremities. No deformities. No thoracic or lumbar midline spinal tenderness. Pelvis is stable. Skin: Skin is warm, dry and intact. No abrasions or contutions. Psychiatric: Speech and behavior are appropriate. Neurological: Normal speech and language. Moves all extremities to command. No gross focal neurologic deficits are appreciated.  Glascow Coma Score: 4 - Opens eyes on own 6 - Follows simple motor commands 4 - Seems confused, disoriented GCS: 14  ____________________________________________   LABS (all labs ordered are listed, but only abnormal results are displayed)  Labs Reviewed    URINALYSIS, COMPLETE (UACMP) WITH MICROSCOPIC - Abnormal; Notable for the following components:      Result Value   Color, Urine YELLOW (*)    APPearance CLOUDY (*)    Ketones, ur 20 (*)    Protein, ur 30 (*)    Nitrite POSITIVE (*)    Leukocytes, UA LARGE (*)    Bacteria, UA MANY (*)    Squamous Epithelial / LPF 0-5 (*)    All other components within normal limits  CBC - Abnormal; Notable for the following components:   MCHC 31.8 (*)    RDW 14.9 (*)    All other components within normal limits  BASIC METABOLIC PANEL - Abnormal; Notable for the following components:   Sodium 149 (*)    Glucose, Bld 100 (*)    BUN 38 (*)    All other components within normal limits  SODIUM - Abnormal; Notable for the following components:   Sodium 147 (*)    All other components within normal limits  URINE CULTURE   ____________________________________________  EKG  ED ECG REPORT I, Nita Sickle, the attending physician, personally viewed and interpreted this ECG.  Normal sinus rhythm, rate of 63, normal intervals, normal axis, diffuse T wave flattening, no ST elevations or depressions.  No significant changes when compared to prior from 2 weeks ago ____________________________________________  RADIOLOGY  I have personally reviewed the images performed during this visit and I agree with the Radiologist's read.   Interpretation by Radiologist:  Ct Head Wo Contrast  Result Date: 06/24/2017 CLINICAL DATA:  Fall. EXAM: CT HEAD WITHOUT CONTRAST CT CERVICAL SPINE WITHOUT CONTRAST TECHNIQUE: Multidetector CT imaging of the head and cervical spine was performed following the standard protocol without intravenous contrast. Multiplanar CT image reconstructions of the cervical spine were also generated. COMPARISON:  CT head and cervical spine dated May 23, 2017. FINDINGS: CT HEAD FINDINGS Brain: No evidence of acute infarction, hemorrhage, hydrocephalus, extra-axial collection or mass  lesion/mass effect. Stable atrophy and chronic microvascular ischemic changes. Vascular: Atherosclerotic vascular calcification of the carotid siphons. No hyperdense vessel. Skull: Negative for fracture or focal lesion. Sinuses/Orbits: No acute finding. Other: Small right frontal scalp hematoma. CT CERVICAL SPINE FINDINGS Alignment: Normal cervical lordosis.  No traumatic malalignment. Skull base and vertebrae: No acute fracture. No primary bone lesion or focal pathologic process. Soft tissues and spinal canal: No prevertebral fluid or swelling. No visible canal hematoma. Disc levels: Moderate  disc height loss and uncovertebral hypertrophy at C5-C6 and C6-C7, unchanged. Moderate facet arthropathy on the right at C4-C5. Upper chest: Negative. Other: None. IMPRESSION: 1. No acute intracranial abnormality. Small right frontal scalp hematoma. 2.  No acute cervical spine fracture. Electronically Signed   By: Obie Dredge M.D.   On: 06/24/2017 17:33   Ct Cervical Spine Wo Contrast  Result Date: 06/24/2017 CLINICAL DATA:  Fall. EXAM: CT HEAD WITHOUT CONTRAST CT CERVICAL SPINE WITHOUT CONTRAST TECHNIQUE: Multidetector CT imaging of the head and cervical spine was performed following the standard protocol without intravenous contrast. Multiplanar CT image reconstructions of the cervical spine were also generated. COMPARISON:  CT head and cervical spine dated May 23, 2017. FINDINGS: CT HEAD FINDINGS Brain: No evidence of acute infarction, hemorrhage, hydrocephalus, extra-axial collection or mass lesion/mass effect. Stable atrophy and chronic microvascular ischemic changes. Vascular: Atherosclerotic vascular calcification of the carotid siphons. No hyperdense vessel. Skull: Negative for fracture or focal lesion. Sinuses/Orbits: No acute finding. Other: Small right frontal scalp hematoma. CT CERVICAL SPINE FINDINGS Alignment: Normal cervical lordosis.  No traumatic malalignment. Skull base and vertebrae: No acute  fracture. No primary bone lesion or focal pathologic process. Soft tissues and spinal canal: No prevertebral fluid or swelling. No visible canal hematoma. Disc levels: Moderate disc height loss and uncovertebral hypertrophy at C5-C6 and C6-C7, unchanged. Moderate facet arthropathy on the right at C4-C5. Upper chest: Negative. Other: None. IMPRESSION: 1. No acute intracranial abnormality. Small right frontal scalp hematoma. 2.  No acute cervical spine fracture. Electronically Signed   By: Obie Dredge M.D.   On: 06/24/2017 17:33      ____________________________________________   PROCEDURES  Procedure(s) performed: None Procedures Critical Care performed:  None ____________________________________________   INITIAL IMPRESSION / ASSESSMENT AND PLAN / ED COURSE   84 y.o. female for history of advanced dementia, frequent falls, hypertension, hyperlipidemia, CVA who presents for a unwitnessed fall.  Patient has old bruises to the right side of her face, an intact cast on the right forearm with brisk capillary refill, soft compartments, strong radial pulse, normal coloration and temperature, no signs of compartment syndrome.  No signs or symptoms of basilar skull fracture.  Patient has advanced dementia and is unable to provide any history.  CT head and cervical spine have been ordered.  EKG with no evidence of ischemia or arrhythmias.  Will check basic labs to evaluate for dehydration, UTI, electrolyte abnormalities, acute kidney injury.    _________________________ 11:18 PM on 06/24/2017 -----------------------------------------  Workup consistent with UTI and mild hyponatremia possibly from dehydration.  Patient was given fluids with improvement of her sodium which is now almost back to baseline.  She was given Rocephin for her UTI.  She is being discharged on Keflex.  CT head and cervical spine with no acute findings.  Patient's family is at the bedside and was updated on findings and plan.   Recommended follow-up with primary care doctor.  Discussed return precautions.   As part of my medical decision making, I reviewed the following data within the electronic MEDICAL RECORD NUMBER History obtained from family, Nursing notes reviewed and incorporated, Labs reviewed , EKG interpreted , Old chart reviewed, Radiograph reviewed , Notes from prior ED visits and [place] Controlled Substance Database    Pertinent labs & imaging results that were available during my care of the patient were reviewed by me and considered in my medical decision making (see chart for details).    ____________________________________________   FINAL CLINICAL IMPRESSION(S) /  ED DIAGNOSES  Final diagnoses:  Fall, initial encounter  Acute cystitis with hematuria  Hypernatremia  Dehydration      NEW MEDICATIONS STARTED DURING THIS VISIT:  ED Discharge Orders        Ordered    cephALEXin (KEFLEX) 500 MG capsule  3 times daily     06/24/17 2318       Note:  This document was prepared using Dragon voice recognition software and may include unintentional dictation errors.    Don Perking, [place], MD 06/24/17 [phone]

## 2017-06-24 NOTE — Discharge Instructions (Addendum)

## 2017-06-24 NOTE — ED Notes (Signed)
Hard copy signed. 

## 2017-06-24 NOTE — ED Triage Notes (Signed)
Pt via EMS from [facility]. Pt has hx of recent fall, sustaining broken R arm and facial contusion. Pt fell today, unable to ascertain if fall was witnessed. NH staff reported no LoC to EMS. Pt is not c/o pain unless R arm is moved. Pt has an intact cast on RFA, fingers warm, dry, splint intact. NH staff additionally reported increased swelling in RFA which is noted upon exam. Pt is sitting w/o complaint on stretcher. Pt is able to report her name, but not DOB.

## 2017-06-25 ENCOUNTER — Emergency Department
Admission: EM | Admit: 2017-06-25 | Discharge: 2017-06-25 | Disposition: A | Discharge status: Home / Self Care | Payer: Medicare Other | Source: Home / Self Care | Attending: Emergency Medicine | Admitting: Emergency Medicine

## 2017-06-25 ENCOUNTER — Emergency Department: Payer: Medicare Other

## 2017-06-25 DIAGNOSIS — Y998 Other external cause status: Secondary | ICD-10-CM

## 2017-06-25 DIAGNOSIS — W0110XA Fall on same level from slipping, tripping and stumbling with subsequent striking against unspecified object, initial encounter: Secondary | ICD-10-CM

## 2017-06-25 DIAGNOSIS — W19XXXA Unspecified fall, initial encounter: Secondary | ICD-10-CM

## 2017-06-25 DIAGNOSIS — Y9389 Activity, other specified: Secondary | ICD-10-CM | POA: Insufficient documentation

## 2017-06-25 DIAGNOSIS — Z79899 Other long term (current) drug therapy: Secondary | ICD-10-CM

## 2017-06-25 DIAGNOSIS — H5509 Other forms of nystagmus: Secondary | ICD-10-CM

## 2017-06-25 DIAGNOSIS — S0083XA Contusion of other part of head, initial encounter: Secondary | ICD-10-CM

## 2017-06-25 DIAGNOSIS — F0390 Unspecified dementia without behavioral disturbance: Secondary | ICD-10-CM

## 2017-06-25 DIAGNOSIS — Z7982 Long term (current) use of aspirin: Secondary | ICD-10-CM

## 2017-06-25 DIAGNOSIS — N3001 Acute cystitis with hematuria: Secondary | ICD-10-CM | POA: Diagnosis not present

## 2017-06-25 DIAGNOSIS — I10 Essential (primary) hypertension: Secondary | ICD-10-CM | POA: Insufficient documentation

## 2017-06-25 DIAGNOSIS — Y92122 Bedroom in nursing home as the place of occurrence of the external cause: Secondary | ICD-10-CM | POA: Insufficient documentation

## 2017-06-25 LAB — CBC WITH DIFFERENTIAL/PLATELET
BASOS ABS: 0.1 10*3/uL (ref 0–0.1)
Basophils Relative: 1 %
EOS ABS: 0 10*3/uL (ref 0–0.7)
EOS PCT: 0 %
HCT: 41.9 % (ref 35.0–47.0)
HEMOGLOBIN: 13.5 g/dL (ref 12.0–16.0)
Lymphocytes Relative: 16 %
Lymphs Abs: 1.4 10*3/uL (ref 1.0–3.6)
MCH: 27.9 pg (ref 26.0–34.0)
MCHC: 32.2 g/dL (ref 32.0–36.0)
MCV: 86.6 fL (ref 80.0–100.0)
Monocytes Absolute: 0.6 10*3/uL (ref 0.2–0.9)
Monocytes Relative: 7 %
NEUTROS PCT: 76 %
Neutro Abs: 6.8 10*3/uL — ABNORMAL HIGH (ref 1.4–6.5)
PLATELETS: 302 10*3/uL (ref 150–440)
RBC: 4.84 MIL/uL (ref 3.80–5.20)
RDW: 14.7 % — ABNORMAL HIGH (ref 11.5–14.5)
WBC: 8.9 10*3/uL (ref 3.6–11.0)

## 2017-06-25 LAB — COMPREHENSIVE METABOLIC PANEL
ALK PHOS: 67 U/L (ref 38–126)
ALT: 10 U/L — AB (ref 14–54)
AST: 23 U/L (ref 15–41)
Albumin: 3.4 g/dL — ABNORMAL LOW (ref 3.5–5.0)
Anion gap: 11 (ref 5–15)
BILIRUBIN TOTAL: 0.9 mg/dL (ref 0.3–1.2)
BUN: 29 mg/dL — AB (ref 6–20)
CALCIUM: 9 mg/dL (ref 8.9–10.3)
CHLORIDE: 106 mmol/L (ref 101–111)
CO2: 27 mmol/L (ref 22–32)
CREATININE: 0.71 mg/dL (ref 0.44–1.00)
Glucose, Bld: 110 mg/dL — ABNORMAL HIGH (ref 65–99)
Potassium: 4 mmol/L (ref 3.5–5.1)
Sodium: 144 mmol/L (ref 135–145)
Total Protein: 6.6 g/dL (ref 6.5–8.1)

## 2017-06-25 NOTE — ED Notes (Signed)
Waiting on EMS transport back 

## 2017-06-25 NOTE — ED Triage Notes (Signed)
Pt arrived from [facility] found at end of bed Naked, wet, with a saturated diaper. Facility stated that they last saw the pt at 3:00am. Pt has dementia. Pt diagnosed with a UTI on yesterday and was discharged from this ED. Pt has not started her antibiotics as of yet. PT has dried blood from nose and an old bruise above her right eye. PT has paperwork and a DNR at the bedside. Pt is alert.

## 2017-06-25 NOTE — Discharge Instructions (Signed)
Ms. Hackleman does not appear to have any acute injuries from her second fall last night.  We repeated CT scans of her head, face, and cervical spine, and there were no new injuries.  She did have a brief period of time where she had very obvious horizontal nystagmus (her eyes were batting back and forth, side to side), but that has resolved as well.  I had a conversation with her daughter, Ms. Chestine SporeClark, about additional workup that would be required to try to find the source of the nystagmus, and she feels that she would not want to proceed with additional workup at this time which I believe is appropriate.  Please have Ms. Madonia seen by her primary care doctor at the next available opportunity and continue to monitor her carefully given what an extreme fall risk she presents.  No medication changes at this time.

## 2017-06-25 NOTE — ED Notes (Addendum)
Pt leaving with [facility] to [facility]. Pt stable on discharge. Pt unable to sign. Pt changed and cleaned prior to transport.

## 2017-06-25 NOTE — ED Notes (Signed)
Granddaughter at bedside to visit. Remains to wait on EMS transport back to facility.

## 2017-06-25 NOTE — ED Notes (Addendum)
Pt resting with eyes closed. VSS. Rails up X 2 for safety. Call bell in reach.  Fall bell on pt.

## 2017-06-25 NOTE — ED Provider Notes (Signed)
[facility] Emergency Department Provider Note  ____________________________________________   First MD Initiated Contact with Patient 06/25/17 [phone]     (approximate)  I have reviewed the triage vital signs and the nursing notes.   HISTORY  Chief Complaint Fall  Level 5 caveat:  history/ROS limited by chronic dementia  HPI Katie Hansen is a 84 y.o. female with a history of dementia and frequent emergency department visits for falls at her facility.  She presented within the last 12 hours or so to the emergency department after a fall, was cleared and diagnosed with a urinary tract infection and discharged back to her facility.  She presents again by EMS within 2 hours of her arrival back at her facility for reevaluation after another unwitnessed fall.  She was reportedly found at the foot of her bed, wet with urine and with some contusions that appear to be acute to her face.  Patient is severely demented at baseline but is able to answer questions.  She has a pre-existing fracture to her right wrist for which I saw her about a week ago.  She has multiple subacute contusions and ecchymoses to her face arms from prior falls.  On this visit she also has some fresh blood in a couple of superficial scrapes to her face.  Most notably, she has acute and severe horizontal nystagmus, but she denies any symptoms at this time.  She answers to her name and is in no acute distress.  Past Medical History:  Diagnosis Date  . Allergic rhinitis, cause unspecified   . Dementia   . Dyslipidemia    high HDL and LDL  . Hypertension     Patient Active Problem List   Diagnosis Date Noted  . CVA (cerebral vascular accident) (HCC) 04/10/2016  . Toenail fungus 08/13/2014  . Hyperglycemia 02/08/2014  . Osteoporosis, senile   . ALLERGIC RHINITIS 02/25/2010  . Dementia without behavioral disturbance 03/28/2009  . Essential hypertension 03/28/2009    Past Surgical History:    Procedure Laterality Date  . ABDOMINAL HYSTERECTOMY    . APPENDECTOMY      Prior to Admission medications   Medication Sig Start Date End Date Taking? Authorizing Provider  acetaminophen (TYLENOL) 500 MG tablet Take 500 mg by mouth 2 (two) times daily. Take 500 mg by mouth twice daily at 8 am and 4 pm. May also take every 4 hours as needed (not to exceed 2000 mg within 24 hours).    [provider]  alum & mag hydroxide-simeth (MINTOX) 200-200-20 MG/5ML suspension Take 30 mLs by mouth every 6 (six) hours as needed for indigestion or heartburn.    [provider]  aspirin EC 81 MG EC tablet Take 1 tablet (81 mg total) by mouth daily. 04/12/16   Alford Highland, MD  cephALEXin (KEFLEX) 500 MG capsule Take 1 capsule (500 mg total) by mouth 3 (three) times daily for 7 days. 06/24/17 07/01/17  Nita Sickle, MD  divalproex (DEPAKOTE SPRINKLE) 125 MG capsule Take 250 mg by mouth 2 (two) times daily.     [provider]  docusate sodium (COLACE) 100 MG capsule Take 100 mg by mouth daily.    [provider]  furosemide (LASIX) 20 MG tablet Take 20 mg by mouth daily.    [provider]  guaifenesin (ROBITUSSIN) 100 MG/5ML syrup Take 200 mg by mouth every 6 (six) hours as needed for cough.    [provider]  HYDROcodone-acetaminophen (NORCO/VICODIN) 5-325 MG tablet  Take 1 tablet by mouth every 4 (four) hours as needed for moderate pain or severe pain. 06/14/17   Loleta Roger, MD  hydrocortisone (ANUSOL-HC) 2.5 % rectal cream Place 1 application rectally 2 (two) times daily as needed for hemorrhoids or anal itching.    [provider]  loperamide (IMODIUM) 2 MG capsule Take 2 mg by mouth as needed for diarrhea or loose stools ((max 8 doses per 24 hours)).     [provider]  LORazepam (ATIVAN) 0.5 MG tablet Take 0.5 mg by mouth 3 (three) times daily.    [provider]  magnesium hydroxide (MILK OF MAGNESIA) 400 MG/5ML  suspension Take 30 mLs by mouth at bedtime as needed for mild constipation.     [provider]  Melatonin 5 MG TABS Take 5 mg by mouth at bedtime.     [provider]  neomycin-bacitracin-polymyxin (NEOSPORIN) OINT Apply 1 application topically as directed.    [provider]  NONFORMULARY OR COMPOUNDED ITEM [LORAZEPAM TOPICAL GEL] - Apply 1ml (1mg ) topically three times daily as needed for agitation    [provider]  polyethylene glycol (MIRALAX / GLYCOLAX) packet Take 17 g by mouth daily as needed.    [provider]  potassium chloride (K-DUR,KLOR-CON) 10 MEQ tablet Take 10 mEq by mouth daily.    [provider]  senna-docusate (SENOKOT-S) 8.6-50 MG tablet Take 1 tablet by mouth 2 (two) times daily.    [provider]  sertraline (ZOLOFT) 50 MG tablet Take 50 mg by mouth every evening.     [provider]  Skin Protectants, Misc. (EUCERIN) cream Apply 1 application topically every morning.    [provider]  traZODone (DESYREL) 50 MG tablet Take 50 mg by mouth every 6 (six) hours as needed for sleep.    [provider]    Allergies Patient has no known allergies.  Family History  Problem Relation Age of Onset  . Colon cancer Mother 61  . Tuberculosis Father     Social History Social History   Tobacco Use  . Smoking status: Never Smoker  . Smokeless tobacco: Never Used  . Tobacco comment: Widowed for many years-spouse died in plane crash. 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 moved to [place] indep living [place]) in 08/2008 to be closer to dtr-lives alone  Substance Use Topics  . Alcohol use: No  . Drug use: No    Review of Systems Level 5 caveat:  history/ROS limited by chronic dementia ____________________________________________   PHYSICAL EXAM:  VITAL SIGNS: ED Triage Vitals  Enc Vitals Group     BP  06/25/17 0600 (!) 155/60     Pulse Rate 06/25/17 0600 (!) 57     Resp 06/25/17 0601 17     Temp 06/25/17 0601 98.1 F (36.7 C)     Temp Source 06/25/17 0601 Oral     SpO2 06/25/17 0600 96 %     Weight 06/25/17 0602 72.6 kg (160 lb)     Height 06/25/17 0602 1.676 m (5\' 6" )     Head Circumference --      Peak Flow --      Pain Score --      Pain Loc --      Pain Edu? --      Excl. in GC? --     Constitutional: Alert to name only.  No acute distress but obvious subacute trauma  to her face Eyes: Conjunctivae are normal. PERRL.  Severe rapidly beating horizontal nystagmus Head: Multiple subacute contusions to the face with 2 small superficial lacerations not requiring intervention. Nose: No congestion/rhinnorhea. Mouth/Throat: Mucous membranes are moist. Neck: No stridor.  No meningeal signs. No cervical spine tenderness to palpation. Cardiovascular: Normal rate, regular rhythm. Good peripheral circulation. Grossly normal heart sounds. Respiratory: Normal respiratory effort.  No retractions. Lungs CTAB. Gastrointestinal: Soft and nontender. No distention.  Musculoskeletal: Cast present on right forearm Neurologic:  Normal speech and language. No gross focal neurologic deficits are appreciated.  Skin:  Skin is warm, dry and intact except as described above Psychiatric: Mood and affect are normal. Speech and behavior are normal.  ____________________________________________   LABS (all labs ordered are listed, but only abnormal results are displayed)  Labs Reviewed  COMPREHENSIVE METABOLIC PANEL - Abnormal; Notable for the following components:      Result Value   Glucose, Bld 110 (*)    BUN 29 (*)    Albumin 3.4 (*)    ALT 10 (*)    All other components within normal limits  CBC WITH DIFFERENTIAL/PLATELET - Abnormal; Notable for the following components:   RDW 14.7 (*)    Neutro Abs 6.8 (*)    All other components within normal limits    ____________________________________________  EKG  None - EKG not ordered by ED physician ____________________________________________  RADIOLOGY   ED MD interpretation: No evidence of acute bony injury nor head bleed on head, neck, and facial CT  Official radiology report(s): Ct Head Wo Contrast  Result Date: 06/25/2017 CLINICAL DATA:  Dementia with questionable fall EXAM: CT HEAD WITHOUT CONTRAST CT MAXILLOFACIAL WITHOUT CONTRAST CT CERVICAL SPINE WITHOUT CONTRAST TECHNIQUE: Multidetector CT imaging of the head, cervical spine, and maxillofacial structures were performed using the standard protocol without intravenous contrast. Multiplanar CT image reconstructions of the cervical spine and maxillofacial structures were also generated. COMPARISON:  CT head and CT cervical spine June 24, 2017 FINDINGS: CT HEAD FINDINGS Brain: Moderate diffuse atrophy is stable. There is no intracranial mass, hemorrhage, extra-axial fluid collection, or midline shift. There is patchy small vessel disease in the centra semiovale bilaterally, stable. There is evidence of a prior small infarct in the right occipital lobe midportion, stable. No new gray-white compartment lesion. No acute infarct evident. Vascular: No hyperdense vessel. There is calcification in each carotid siphon. Skull: Bony calvarium appears intact. There is a right frontal scalp hematoma. Other: Mastoid air cells are clear. CT MAXILLOFACIAL FINDINGS Osseous: There is no appreciable fracture or dislocation. No blastic or lytic bone lesions evident. Orbits: There is soft tissue swelling over the right orbit in a preseptal location. There is no intraorbital lesion. Orbits appear symmetric bilaterally. The patient has had cataract removals bilaterally. Sinuses: There is mucosal thickening in the inferior most aspect of the right maxillary antrum. There is mucosal thickening in multiple ethmoid air cells. Other paranasal sinuses are clear. No air-fluid  level. No bony destruction or expansion. Ostiomeatal unit complexes are patent bilaterally, although there is mild edema in each infundibulum causing narrowing bilaterally in these areas. There is no nasal cavity obstruction. Soft tissues: There is a right frontal scalp hematoma with soft tissue swelling over the right upper face and preseptal right orbital region. No well-defined hematoma. No abscess. Salivary glands appear normal. No adenopathy. Tongue and tongue base regions appear normal. Visualized pharynx appears normal. CT CERVICAL SPINE FINDINGS Alignment: There is no appreciable spondylolisthesis. Skull base and vertebrae: Skull base  and craniocervical junction regions appear normal. There is no evident fracture. No blastic or lytic bone lesions. Soft tissues and spinal canal: Prevertebral soft tissues and predental space regions are normal. There are no paraspinous lesions. There is no evident cord or canal hematoma. Disc levels: There is moderately severe disc space narrowing at C5-6 and C6-7 with slightly milder disc space narrowing at C7-T1. There is facet osteoarthritic change at multiple levels. There is exit foraminal narrowing impressing on exiting nerve roots at C4-5 on the right, C5-6 bilaterally, and C6-7 bilaterally, more severe on the left than on the right. No disc extrusion or high-grade stenosis. Upper chest: Visualized upper lung zones are clear. Other: Calcification noted in each carotid artery. IMPRESSION: CT head: Atrophy with periventricular small vessel disease. Prior small right occipital lobe infarct. No acute hemorrhage or mass. There is a right frontal scalp hematoma. There is arterial vascular calcification. CT maxillofacial: No evident fracture or dislocation. Soft tissue swelling over upper right face and preseptal orbital region. No well-defined hematoma. Areas of mucosal thickening in multiple ethmoid air cells as well as to a much lesser extent in the inferior right maxillary  antrum. Narrowing of each infundibulum of the ostiomeatal unit complex, but no ostiomeatal unit complex obstruction evident. CT cervical spine: No fracture or spondylolisthesis. Multilevel arthropathy. Calcification in both carotid arteries. Electronically Signed   By: Bretta Bang III M.D.   On: 06/25/2017 07:21   Ct Head Wo Contrast  Result Date: 06/24/2017 CLINICAL DATA:  Fall. EXAM: CT HEAD WITHOUT CONTRAST CT CERVICAL SPINE WITHOUT CONTRAST TECHNIQUE: Multidetector CT imaging of the head and cervical spine was performed following the standard protocol without intravenous contrast. Multiplanar CT image reconstructions of the cervical spine were also generated. COMPARISON:  CT head and cervical spine dated May 23, 2017. FINDINGS: CT HEAD FINDINGS Brain: No evidence of acute infarction, hemorrhage, hydrocephalus, extra-axial collection or mass lesion/mass effect. Stable atrophy and chronic microvascular ischemic changes. Vascular: Atherosclerotic vascular calcification of the carotid siphons. No hyperdense vessel. Skull: Negative for fracture or focal lesion. Sinuses/Orbits: No acute finding. Other: Small right frontal scalp hematoma. CT CERVICAL SPINE FINDINGS Alignment: Normal cervical lordosis.  No traumatic malalignment. Skull base and vertebrae: No acute fracture. No primary bone lesion or focal pathologic process. Soft tissues and spinal canal: No prevertebral fluid or swelling. No visible canal hematoma. Disc levels: Moderate disc height loss and uncovertebral hypertrophy at C5-C6 and C6-C7, unchanged. Moderate facet arthropathy on the right at C4-C5. Upper chest: Negative. Other: None. IMPRESSION: 1. No acute intracranial abnormality. Small right frontal scalp hematoma. 2.  No acute cervical spine fracture. Electronically Signed   By: Obie Dredge M.D.   On: 06/24/2017 17:33   Ct Cervical Spine Wo Contrast  Result Date: 06/25/2017 CLINICAL DATA:  Dementia with questionable fall EXAM: CT HEAD  WITHOUT CONTRAST CT MAXILLOFACIAL WITHOUT CONTRAST CT CERVICAL SPINE WITHOUT CONTRAST TECHNIQUE: Multidetector CT imaging of the head, cervical spine, and maxillofacial structures were performed using the standard protocol without intravenous contrast. Multiplanar CT image reconstructions of the cervical spine and maxillofacial structures were also generated. COMPARISON:  CT head and CT cervical spine June 24, 2017 FINDINGS: CT HEAD FINDINGS Brain: Moderate diffuse atrophy is stable. There is no intracranial mass, hemorrhage, extra-axial fluid collection, or midline shift. There is patchy small vessel disease in the centra semiovale bilaterally, stable. There is evidence of a prior small infarct in the right occipital lobe midportion, stable. No new gray-white compartment lesion. No acute infarct evident.  Vascular: No hyperdense vessel. There is calcification in each carotid siphon. Skull: Bony calvarium appears intact. There is a right frontal scalp hematoma. Other: Mastoid air cells are clear. CT MAXILLOFACIAL FINDINGS Osseous: There is no appreciable fracture or dislocation. No blastic or lytic bone lesions evident. Orbits: There is soft tissue swelling over the right orbit in a preseptal location. There is no intraorbital lesion. Orbits appear symmetric bilaterally. The patient has had cataract removals bilaterally. Sinuses: There is mucosal thickening in the inferior most aspect of the right maxillary antrum. There is mucosal thickening in multiple ethmoid air cells. Other paranasal sinuses are clear. No air-fluid level. No bony destruction or expansion. Ostiomeatal unit complexes are patent bilaterally, although there is mild edema in each infundibulum causing narrowing bilaterally in these areas. There is no nasal cavity obstruction. Soft tissues: There is a right frontal scalp hematoma with soft tissue swelling over the right upper face and preseptal right orbital region. No well-defined hematoma. No  abscess. Salivary glands appear normal. No adenopathy. Tongue and tongue base regions appear normal. Visualized pharynx appears normal. CT CERVICAL SPINE FINDINGS Alignment: There is no appreciable spondylolisthesis. Skull base and vertebrae: Skull base and craniocervical junction regions appear normal. There is no evident fracture. No blastic or lytic bone lesions. Soft tissues and spinal canal: Prevertebral soft tissues and predental space regions are normal. There are no paraspinous lesions. There is no evident cord or canal hematoma. Disc levels: There is moderately severe disc space narrowing at C5-6 and C6-7 with slightly milder disc space narrowing at C7-T1. There is facet osteoarthritic change at multiple levels. There is exit foraminal narrowing impressing on exiting nerve roots at C4-5 on the right, C5-6 bilaterally, and C6-7 bilaterally, more severe on the left than on the right. No disc extrusion or high-grade stenosis. Upper chest: Visualized upper lung zones are clear. Other: Calcification noted in each carotid artery. IMPRESSION: CT head: Atrophy with periventricular small vessel disease. Prior small right occipital lobe infarct. No acute hemorrhage or mass. There is a right frontal scalp hematoma. There is arterial vascular calcification. CT maxillofacial: No evident fracture or dislocation. Soft tissue swelling over upper right face and preseptal orbital region. No well-defined hematoma. Areas of mucosal thickening in multiple ethmoid air cells as well as to a much lesser extent in the inferior right maxillary antrum. Narrowing of each infundibulum of the ostiomeatal unit complex, but no ostiomeatal unit complex obstruction evident. CT cervical spine: No fracture or spondylolisthesis. Multilevel arthropathy. Calcification in both carotid arteries. Electronically Signed   By: Bretta Bang III M.D.   On: 06/25/2017 07:21   Ct Cervical Spine Wo Contrast  Result Date: 06/24/2017 CLINICAL DATA:   Fall. EXAM: CT HEAD WITHOUT CONTRAST CT CERVICAL SPINE WITHOUT CONTRAST TECHNIQUE: Multidetector CT imaging of the head and cervical spine was performed following the standard protocol without intravenous contrast. Multiplanar CT image reconstructions of the cervical spine were also generated. COMPARISON:  CT head and cervical spine dated May 23, 2017. FINDINGS: CT HEAD FINDINGS Brain: No evidence of acute infarction, hemorrhage, hydrocephalus, extra-axial collection or mass lesion/mass effect. Stable atrophy and chronic microvascular ischemic changes. Vascular: Atherosclerotic vascular calcification of the carotid siphons. No hyperdense vessel. Skull: Negative for fracture or focal lesion. Sinuses/Orbits: No acute finding. Other: Small right frontal scalp hematoma. CT CERVICAL SPINE FINDINGS Alignment: Normal cervical lordosis.  No traumatic malalignment. Skull base and vertebrae: No acute fracture. No primary bone lesion or focal pathologic process. Soft tissues and spinal canal: No prevertebral  fluid or swelling. No visible canal hematoma. Disc levels: Moderate disc height loss and uncovertebral hypertrophy at C5-C6 and C6-C7, unchanged. Moderate facet arthropathy on the right at C4-C5. Upper chest: Negative. Other: None. IMPRESSION: 1. No acute intracranial abnormality. Small right frontal scalp hematoma. 2.  No acute cervical spine fracture. Electronically Signed   By: Obie Dredge M.D.   On: 06/24/2017 17:33   Ct Maxillofacial Wo Contrast  Result Date: 06/25/2017 CLINICAL DATA:  Dementia with questionable fall EXAM: CT HEAD WITHOUT CONTRAST CT MAXILLOFACIAL WITHOUT CONTRAST CT CERVICAL SPINE WITHOUT CONTRAST TECHNIQUE: Multidetector CT imaging of the head, cervical spine, and maxillofacial structures were performed using the standard protocol without intravenous contrast. Multiplanar CT image reconstructions of the cervical spine and maxillofacial structures were also generated. COMPARISON:  CT head and  CT cervical spine June 24, 2017 FINDINGS: CT HEAD FINDINGS Brain: Moderate diffuse atrophy is stable. There is no intracranial mass, hemorrhage, extra-axial fluid collection, or midline shift. There is patchy small vessel disease in the centra semiovale bilaterally, stable. There is evidence of a prior small infarct in the right occipital lobe midportion, stable. No new gray-white compartment lesion. No acute infarct evident. Vascular: No hyperdense vessel. There is calcification in each carotid siphon. Skull: Bony calvarium appears intact. There is a right frontal scalp hematoma. Other: Mastoid air cells are clear. CT MAXILLOFACIAL FINDINGS Osseous: There is no appreciable fracture or dislocation. No blastic or lytic bone lesions evident. Orbits: There is soft tissue swelling over the right orbit in a preseptal location. There is no intraorbital lesion. Orbits appear symmetric bilaterally. The patient has had cataract removals bilaterally. Sinuses: There is mucosal thickening in the inferior most aspect of the right maxillary antrum. There is mucosal thickening in multiple ethmoid air cells. Other paranasal sinuses are clear. No air-fluid level. No bony destruction or expansion. Ostiomeatal unit complexes are patent bilaterally, although there is mild edema in each infundibulum causing narrowing bilaterally in these areas. There is no nasal cavity obstruction. Soft tissues: There is a right frontal scalp hematoma with soft tissue swelling over the right upper face and preseptal right orbital region. No well-defined hematoma. No abscess. Salivary glands appear normal. No adenopathy. Tongue and tongue base regions appear normal. Visualized pharynx appears normal. CT CERVICAL SPINE FINDINGS Alignment: There is no appreciable spondylolisthesis. Skull base and vertebrae: Skull base and craniocervical junction regions appear normal. There is no evident fracture. No blastic or lytic bone lesions. Soft tissues and spinal  canal: Prevertebral soft tissues and predental space regions are normal. There are no paraspinous lesions. There is no evident cord or canal hematoma. Disc levels: There is moderately severe disc space narrowing at C5-6 and C6-7 with slightly milder disc space narrowing at C7-T1. There is facet osteoarthritic change at multiple levels. There is exit foraminal narrowing impressing on exiting nerve roots at C4-5 on the right, C5-6 bilaterally, and C6-7 bilaterally, more severe on the left than on the right. No disc extrusion or high-grade stenosis. Upper chest: Visualized upper lung zones are clear. Other: Calcification noted in each carotid artery. IMPRESSION: CT head: Atrophy with periventricular small vessel disease. Prior small right occipital lobe infarct. No acute hemorrhage or mass. There is a right frontal scalp hematoma. There is arterial vascular calcification. CT maxillofacial: No evident fracture or dislocation. Soft tissue swelling over upper right face and preseptal orbital region. No well-defined hematoma. Areas of mucosal thickening in multiple ethmoid air cells as well as to a much lesser extent in the inferior  right maxillary antrum. Narrowing of each infundibulum of the ostiomeatal unit complex, but no ostiomeatal unit complex obstruction evident. CT cervical spine: No fracture or spondylolisthesis. Multilevel arthropathy. Calcification in both carotid arteries. Electronically Signed   By: Bretta Bang III M.D.   On: 06/25/2017 07:21    ____________________________________________   PROCEDURES  Critical Care performed: No   Procedure(s) performed:   Procedures   ____________________________________________   INITIAL IMPRESSION / ASSESSMENT AND PLAN / ED COURSE  As part of my medical decision making, I reviewed the following data within the electronic MEDICAL RECORD NUMBER History obtained from family, Nursing notes reviewed and incorporated, Old chart reviewed and Notes from  prior ED visits    Differential diagnosis includes, but is not limited to, acute head bleed, osseous injury, CVA, neurological inflammation or infection.  Lab work unremarkable.  Patient is currently being treated for urinary tract infection.  No obvious acute orthopedic or traumatic injury.  The nystagmus resolved spontaneously.  I was concerned initially about the strong probability of CVA contributing to her recent falls and causing the nystagmus.  However the patient is very demented and uncooperative and would require heavy sedation which could have severe risks, particularly given the sedation that would require to get her through an extensive MRI process.  I called and spoke with her daughter, [name], who is her healthcare power of attorney.  We discussed in detail the patient's symptoms and the workup that would be needed to identify the cause of nystagmus, and she very appropriately asked "we would do even if we did find strokes.  The patient is already DNR/DNI and Ms. Clark said that she would not want to pursue any aggressive treatment.  I think that is very appropriate under the circumstances particularly given the severity of the dementia.  She understands that we may be not identifying a severe and acute intracranial abnormality but she is comfortable with that plan and again I do think this is appropriate.  We will discharge the patient back to her facility transported by EMS as soon as possible.  The patient is in no acute distress although she is agitated at being in the emergency department.     ____________________________________________  FINAL CLINICAL IMPRESSION(S) / ED DIAGNOSES  Final diagnoses:  Fall, initial encounter  Contusion of face, initial encounter  Horizontal nystagmus     MEDICATIONS GIVEN DURING THIS VISIT:  Medications - No data to display   ED Discharge Orders    None       Note:  This document was prepared using Dragon voice recognition  software and may include unintentional dictation errors.    Loleta Emmarae, MD 06/25/17 [phone]

## 2017-06-25 NOTE — ED Notes (Signed)
Report received.  Pt returned from imaging.

## 2017-06-27 LAB — URINE CULTURE: Culture: >=100000 — AB

## 2017-11-09 ENCOUNTER — Other Ambulatory Visit: Payer: Self-pay

## 2017-11-09 ENCOUNTER — Observation Stay
Admission: EM | Admit: 2017-11-09 | Discharge: 2017-11-11 | Disposition: A | Discharge status: Nursing Home (Includes ICF) | Payer: Medicare Other | Attending: Internal Medicine | Admitting: Internal Medicine

## 2017-11-09 ENCOUNTER — Encounter: Payer: Self-pay | Admitting: Emergency Medicine

## 2017-11-09 ENCOUNTER — Emergency Department: Payer: Medicare Other

## 2017-11-09 DIAGNOSIS — Y92129 Unspecified place in nursing home as the place of occurrence of the external cause: Secondary | ICD-10-CM | POA: Insufficient documentation

## 2017-11-09 DIAGNOSIS — G309 Alzheimer's disease, unspecified: Secondary | ICD-10-CM | POA: Diagnosis not present

## 2017-11-09 DIAGNOSIS — W19XXXA Unspecified fall, initial encounter: Secondary | ICD-10-CM

## 2017-11-09 DIAGNOSIS — Z79891 Long term (current) use of opiate analgesic: Secondary | ICD-10-CM | POA: Insufficient documentation

## 2017-11-09 DIAGNOSIS — I10 Essential (primary) hypertension: Secondary | ICD-10-CM | POA: Diagnosis not present

## 2017-11-09 DIAGNOSIS — Z79899 Other long term (current) drug therapy: Secondary | ICD-10-CM | POA: Insufficient documentation

## 2017-11-09 DIAGNOSIS — Z7982 Long term (current) use of aspirin: Secondary | ICD-10-CM | POA: Diagnosis not present

## 2017-11-09 DIAGNOSIS — F0280 Dementia in other diseases classified elsewhere without behavioral disturbance: Secondary | ICD-10-CM | POA: Insufficient documentation

## 2017-11-09 DIAGNOSIS — R2689 Other abnormalities of gait and mobility: Secondary | ICD-10-CM | POA: Insufficient documentation

## 2017-11-09 DIAGNOSIS — Z66 Do not resuscitate: Secondary | ICD-10-CM | POA: Insufficient documentation

## 2017-11-09 DIAGNOSIS — J939 Pneumothorax, unspecified: Secondary | ICD-10-CM

## 2017-11-09 DIAGNOSIS — S2242XA Multiple fractures of ribs, left side, initial encounter for closed fracture: Secondary | ICD-10-CM | POA: Diagnosis not present

## 2017-11-09 DIAGNOSIS — R531 Weakness: Secondary | ICD-10-CM | POA: Diagnosis not present

## 2017-11-09 DIAGNOSIS — S2249XA Multiple fractures of ribs, unspecified side, initial encounter for closed fracture: Secondary | ICD-10-CM | POA: Diagnosis present

## 2017-11-09 DIAGNOSIS — M549 Dorsalgia, unspecified: Secondary | ICD-10-CM

## 2017-11-09 LAB — CBC
HEMATOCRIT: 42.5 % (ref 35.0–47.0)
HEMOGLOBIN: 13.9 g/dL (ref 12.0–16.0)
MCH: 28 pg (ref 26.0–34.0)
MCHC: 32.7 g/dL (ref 32.0–36.0)
MCV: 85.8 fL (ref 80.0–100.0)
Platelets: 330 10*3/uL (ref 150–440)
RBC: 4.95 MIL/uL (ref 3.80–5.20)
RDW: 14.1 % (ref 11.5–14.5)
WBC: 13.2 10*3/uL — AB (ref 3.6–11.0)

## 2017-11-09 LAB — BASIC METABOLIC PANEL
ANION GAP: 10 (ref 5–15)
BUN: 15 mg/dL (ref 8–23)
CALCIUM: 9.5 mg/dL (ref 8.9–10.3)
CHLORIDE: 105 mmol/L (ref 98–111)
CO2: 26 mmol/L (ref 22–32)
Creatinine, Ser: 0.53 mg/dL (ref 0.44–1.00)
GFR calc non Af Amer: >60 mL/min (ref >60)
Glucose, Bld: 138 mg/dL — ABNORMAL HIGH (ref 70–99)
POTASSIUM: 3.7 mmol/L (ref 3.5–5.1)
Sodium: 141 mmol/L (ref 135–145)

## 2017-11-09 MED ORDER — MELATONIN 5 MG PO TABS
5.0000 mg | ORAL_TABLET | Freq: Every day | ORAL | Status: DC
  Administered 2017-11-09 – 2017-11-10 (×2): 5 mg via ORAL
  Filled 2017-11-09 (×3): qty 1

## 2017-11-09 MED ORDER — HALOPERIDOL LACTATE 5 MG/ML IJ SOLN
5.0000 mg | Freq: Once | INTRAMUSCULAR | Status: AC
  Administered 2017-11-09: 5 mg via INTRAMUSCULAR
  Filled 2017-11-09: qty 1

## 2017-11-09 MED ORDER — POTASSIUM CHLORIDE CRYS ER 20 MEQ PO TBCR
10.0000 meq | EXTENDED_RELEASE_TABLET | Freq: Every day | ORAL | Status: DC
  Administered 2017-11-10: 10 meq via ORAL
  Filled 2017-11-09: qty 1

## 2017-11-09 MED ORDER — ACETAMINOPHEN 650 MG RE SUPP: 650.0000 mg | Freq: Four times a day (QID) | RECTAL | Status: DC | PRN

## 2017-11-09 MED ORDER — ONDANSETRON HCL 4 MG PO TABS: 4.0000 mg | ORAL_TABLET | Freq: Four times a day (QID) | ORAL | Status: DC | PRN

## 2017-11-09 MED ORDER — ACETAMINOPHEN 325 MG PO TABS
650.0000 mg | ORAL_TABLET | Freq: Four times a day (QID) | ORAL | Status: DC | PRN
  Administered 2017-11-11: 650 mg via ORAL
  Filled 2017-11-09: qty 2

## 2017-11-09 MED ORDER — ALUM & MAG HYDROXIDE-SIMETH 200-200-20 MG/5ML PO SUSP: 30.0000 mL | Freq: Four times a day (QID) | ORAL | Status: DC | PRN

## 2017-11-09 MED ORDER — LORAZEPAM 0.5 MG PO TABS
0.5000 mg | ORAL_TABLET | Freq: Three times a day (TID) | ORAL | Status: DC
  Administered 2017-11-09 – 2017-11-11 (×5): 0.5 mg via ORAL
  Filled 2017-11-09 (×5): qty 1

## 2017-11-09 MED ORDER — DIVALPROEX SODIUM 125 MG PO DR TAB
125.0000 mg | DELAYED_RELEASE_TABLET | Freq: Once | ORAL | Status: DC
  Filled 2017-11-09: qty 1

## 2017-11-09 MED ORDER — ASPIRIN EC 81 MG PO TBEC
81.0000 mg | DELAYED_RELEASE_TABLET | Freq: Every day | ORAL | Status: DC
  Administered 2017-11-10 – 2017-11-11 (×2): 81 mg via ORAL
  Filled 2017-11-09 (×2): qty 1

## 2017-11-09 MED ORDER — FENTANYL CITRATE (PF) 100 MCG/2ML IJ SOLN
50.0000 ug | Freq: Once | INTRAMUSCULAR | Status: AC
  Administered 2017-11-09: 50 ug via INTRAVENOUS
  Filled 2017-11-09: qty 2

## 2017-11-09 MED ORDER — OXYCODONE HCL 5 MG PO TABS
5.0000 mg | ORAL_TABLET | ORAL | Status: DC | PRN
  Administered 2017-11-09 – 2017-11-10 (×3): 5 mg via ORAL
  Filled 2017-11-09 (×3): qty 1

## 2017-11-09 MED ORDER — HYDROCORTISONE 2.5 % RE CREA
1.0000 "application " | TOPICAL_CREAM | Freq: Two times a day (BID) | RECTAL | Status: DC | PRN
  Filled 2017-11-09: qty 28.35

## 2017-11-09 MED ORDER — FUROSEMIDE 40 MG PO TABS
20.0000 mg | ORAL_TABLET | Freq: Every day | ORAL | Status: DC
  Administered 2017-11-10 – 2017-11-11 (×2): 20 mg via ORAL
  Filled 2017-11-09 (×2): qty 1

## 2017-11-09 MED ORDER — HYDROCERIN EX CREA
1.0000 "application " | TOPICAL_CREAM | CUTANEOUS | Status: DC
  Administered 2017-11-10 – 2017-11-11 (×2): 1 via TOPICAL
  Filled 2017-11-09: qty 113

## 2017-11-09 MED ORDER — SERTRALINE HCL 50 MG PO TABS
50.0000 mg | ORAL_TABLET | Freq: Every evening | ORAL | Status: DC
  Administered 2017-11-09 – 2017-11-10 (×2): 50 mg via ORAL
  Filled 2017-11-09 (×2): qty 1

## 2017-11-09 MED ORDER — TRAZODONE HCL 50 MG PO TABS: 50.0000 mg | ORAL_TABLET | Freq: Four times a day (QID) | ORAL | Status: DC | PRN

## 2017-11-09 MED ORDER — ENOXAPARIN SODIUM 40 MG/0.4ML ~~LOC~~ SOLN
40.0000 mg | SUBCUTANEOUS | Status: DC
  Administered 2017-11-09 – 2017-11-10 (×2): 40 mg via SUBCUTANEOUS
  Filled 2017-11-09 (×2): qty 0.4

## 2017-11-09 MED ORDER — SENNOSIDES-DOCUSATE SODIUM 8.6-50 MG PO TABS
1.0000 | ORAL_TABLET | Freq: Two times a day (BID) | ORAL | Status: DC
  Administered 2017-11-09 – 2017-11-11 (×4): 1 via ORAL
  Filled 2017-11-09 (×4): qty 1

## 2017-11-09 MED ORDER — MAGNESIUM HYDROXIDE 400 MG/5ML PO SUSP
30.0000 mL | Freq: Every evening | ORAL | Status: DC | PRN
  Filled 2017-11-09: qty 30

## 2017-11-09 MED ORDER — ONDANSETRON HCL 4 MG/2ML IJ SOLN: 4.0000 mg | Freq: Four times a day (QID) | INTRAMUSCULAR | Status: DC | PRN

## 2017-11-09 MED ORDER — DIVALPROEX SODIUM 125 MG PO CSDR
250.0000 mg | DELAYED_RELEASE_CAPSULE | Freq: Two times a day (BID) | ORAL | Status: DC
  Administered 2017-11-09 – 2017-11-11 (×4): 250 mg via ORAL
  Filled 2017-11-09 (×5): qty 2

## 2017-11-09 MED ORDER — POLYETHYLENE GLYCOL 3350 17 G PO PACK: 17.0000 g | PACK | Freq: Every day | ORAL | Status: DC | PRN

## 2017-11-09 MED ORDER — DOCUSATE SODIUM 100 MG PO CAPS
100.0000 mg | ORAL_CAPSULE | Freq: Every day | ORAL | Status: DC
  Administered 2017-11-10: 100 mg via ORAL
  Filled 2017-11-09: qty 1
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
## 2017-11-09 NOTE — ED Notes (Signed)
IV placed. Gauze dressing wrapped around for safety. Pt tolerated well. Attempted to give Depakote. Pt placed in her mouth and chewed it. Pt realized it tasted bad and spit it out. Tried to get pt to drink water to swallow and rinse her mouth but pt does not understand. Dr Hilton SinclairWeiting made aware that pt may have difficulty with PO meds. Will notify RN on floor they may want to try applesauce.
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
## 2017-11-09 NOTE — ED Notes (Signed)
Wet clothes removed, pt cleaned and a new diaper placed. Clean sheets placed on the bed.
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
## 2017-11-09 NOTE — ED Notes (Signed)
Fall posey placed on pt at this time for fall precautions

## 2017-11-09 NOTE — ED Provider Notes (Signed)
[facility] Emergency Department Provider Note  Time seen: 10:20 AM  I have reviewed the triage vital signs and the nursing notes.   HISTORY  Chief Complaint Fall and Back Pain    HPI Katie Hansen is a 84 y.o. female with a past medical history of dementia, hypertension, hyperlipidemia, presents to the emergency department for back pain and rib pain.  According EMS report at the nursing facility the patient had a fall yesterday, family did not want her transferred to the ER at that time.  However this morning she was complaining of rib pain and back pain so they sent her for ER evaluation.  Here the patient is calm, cooperative, she has dementia and cannot contribute to her history or review of systems.  Does not recall the event has no complaints lying in bed.  However during evaluation she does have pain in her back with movement but cannot tell me where in her back, or quantify the pain.   Past Medical History:  Diagnosis Date  . Allergic rhinitis, cause unspecified   . Dementia   . Dyslipidemia    high HDL and LDL  . Hypertension     Patient Active Problem List   Diagnosis Date Noted  . CVA (cerebral vascular accident) (HCC) 04/10/2016  . Toenail fungus 08/13/2014  . Hyperglycemia 02/08/2014  . Osteoporosis, senile   . ALLERGIC RHINITIS 02/25/2010  . Dementia without behavioral disturbance 03/28/2009  . Essential hypertension 03/28/2009    Past Surgical History:  Procedure Laterality Date  . ABDOMINAL HYSTERECTOMY    . APPENDECTOMY      Prior to Admission medications   Medication Sig Start Date End Date Taking? Authorizing Provider  acetaminophen (TYLENOL) 500 MG tablet Take 500 mg by mouth 2 (two) times daily. Take 500 mg by mouth twice daily at 8 am and 4 pm. May also take every 4 hours as needed (not to exceed 2000 mg within 24 hours).    [provider]  alum & mag hydroxide-simeth (MINTOX) 200-200-20 MG/5ML suspension Take 30  mLs by mouth every 6 (six) hours as needed for indigestion or heartburn.    [provider]  aspirin EC 81 MG EC tablet Take 1 tablet (81 mg total) by mouth daily. 04/12/16   Alford HighlandWieting, Richard, MD  divalproex (DEPAKOTE SPRINKLE) 125 MG capsule Take 250 mg by mouth 2 (two) times daily.     [provider]  docusate sodium (COLACE) 100 MG capsule Take 100 mg by mouth daily.    [provider]  furosemide (LASIX) 20 MG tablet Take 20 mg by mouth daily.    [provider]  guaifenesin (ROBITUSSIN) 100 MG/5ML syrup Take 200 mg by mouth every 6 (six) hours as needed for cough.    [provider]  HYDROcodone-acetaminophen (NORCO/VICODIN) 5-325 MG tablet Take 1 tablet by mouth every 4 (four) hours as needed for moderate pain or severe pain. 06/14/17   Loleta RoseForbach, Cory, MD  hydrocortisone (ANUSOL-HC) 2.5 % rectal cream Place 1 application rectally 2 (two) times daily as needed for hemorrhoids or anal itching.    [provider]  loperamide (IMODIUM) 2 MG capsule Take 2 mg by mouth as needed for diarrhea or loose stools ((max 8 doses per 24 hours)).     [provider]  LORazepam (ATIVAN) 0.5 MG tablet Take 0.5 mg by mouth 3 (three) times daily.    [provider]  magnesium hydroxide (MILK OF MAGNESIA) 400 MG/5ML suspension Take  30 mLs by mouth at bedtime as needed for mild constipation.     [provider]  Melatonin 5 MG TABS Take 5 mg by mouth at bedtime.     [provider]  neomycin-bacitracin-polymyxin (NEOSPORIN) OINT Apply 1 application topically as directed.    [provider]  NONFORMULARY OR COMPOUNDED ITEM [LORAZEPAM TOPICAL GEL] - Apply 1ml (1mg ) topically three times daily as needed for agitation    [provider]  polyethylene glycol (MIRALAX / GLYCOLAX) packet Take 17 g by mouth daily as needed.    [provider]  potassium chloride (K-DUR,KLOR-CON) 10 MEQ tablet Take 10 mEq by  mouth daily.    [provider]  senna-docusate (SENOKOT-S) 8.6-50 MG tablet Take 1 tablet by mouth 2 (two) times daily.    [provider]  sertraline (ZOLOFT) 50 MG tablet Take 50 mg by mouth every evening.     [provider]  Skin Protectants, Misc. (EUCERIN) cream Apply 1 application topically every morning.    [provider]  traZODone (DESYREL) 50 MG tablet Take 50 mg by mouth every 6 (six) hours as needed for sleep.    [provider]    No Known Allergies  Family History  Problem Relation Age of Onset  . Colon cancer Mother 863  . Tuberculosis Father     Social History Social History   Tobacco Use  . Smoking status: Never Smoker  . Smokeless tobacco: Never Used  . Tobacco comment: Widowed for many years-spouse died in plane crash. 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 moved to [place] ind[place] living [facility] in 08/2008 to be closer to dtr-lives alone  Substance Use Topics  . Alcohol use: No  . Drug use: No    Review of Systems Unable to obtain an adequate/accurate review of systems secondary to dementia  ____________________________________________   PHYSICAL EXAM:  VITAL SIGNS: ED Triage Vitals [11/09/17 0951]  Enc Vitals Group     BP 95/82     Pulse Rate 76     Resp 14     Temp 98.2 F (36.8 C)     Temp Source Axillary     SpO2 98 %     Weight      Height      Head Circumference      Peak Flow      Pain Score      Pain Loc      Pain Edu?      Excl. in GC?    Constitutional: Patient is awake and alert, calm and cooperative. Eyes: Normal exam ENT   Head: Normocephalic and atraumatic.   Mouth/Throat: Mucous membranes are moist. Cardiovascular: Normal rate, regular rhythm.  Respiratory: Normal respiratory effort without tachypnea nor retractions. Breath sounds are clear  Gastrointestinal: Soft and nontender. No distention.    Musculoskeletal: Nontender with normal range of motion in all extremities.,  No obvious reaction to back palpation but the patient did appear uncomfortable when attempting to sit up from the bed. Neurologic:  Normal speech and language. No gross focal neurologic deficits  Skin:  Skin is warm, dry and intact.  Psychiatric: Mood and affect are normal.   ____________________________________________   RADIOLOGY  X-rays show no thoracic or lumbar fracture does show 3 rib fractures with a very small pneumothorax.  ____________________________________________   INITIAL IMPRESSION / ASSESSMENT AND PLAN / ED COURSE  Pertinent labs & imaging results that  were available during my care of the patient were reviewed by me and considered in my medical decision making (see chart for details).  Shortly after evaluation during attempted imaging the patient did become more confused and combative.  Has a history of dementia and this is likely the patient's baseline.  Patient attempting to get out of bed on her own was unwilling to let them finish imaging.  Dose 5 mg of IM Haldol.  Patient is now much more calm cooperative.  Was able to send for repeat imaging of the back as a precaution.  X-ray is negative for thoracic spine fracture but does show several left-sided rib fractures.  Lumbar spine x-ray negative.  Chest x-ray shows several rib fractures with a minimal left apical pneumothorax.  X-ray shows very small pneumothorax.  I discussed patient with Dr. Thelma Barge who recommends admission to the hospital service for repeat x-ray in the morning if the repeat x-ray is negative patient may be discharged back to her nursing facility.  I attempted to contact the patient's daughter [name], left a message but have not had success in reaching area.  ____________________________________________   FINAL CLINICAL IMPRESSION(S) / ED DIAGNOSES  Rib fractures, multiple Pneumothorax    Minna Antis,  MD 11/09/17 1610

## 2017-11-09 NOTE — ED Notes (Signed)
Dr Hilton SinclairWeiting in with pt. Pt is yelling and howling and pulling at clothes. Pt is not attempting to get out of bed.

## 2017-11-10 ENCOUNTER — Observation Stay: Payer: Medicare Other

## 2017-11-10 DIAGNOSIS — S2242XA Multiple fractures of ribs, left side, initial encounter for closed fracture: Secondary | ICD-10-CM | POA: Diagnosis not present

## 2017-11-10 LAB — URINALYSIS, ROUTINE W REFLEX MICROSCOPIC
Bilirubin Urine: NEGATIVE
GLUCOSE, UA: NEGATIVE mg/dL
Ketones, ur: NEGATIVE mg/dL
NITRITE: POSITIVE — AB
PROTEIN: NEGATIVE mg/dL
Specific Gravity, Urine: 1.005 (ref 1.005–1.030)
pH: 6 (ref 5.0–8.0)

## 2017-11-10 LAB — BASIC METABOLIC PANEL
Anion gap: 8 (ref 5–15)
BUN: 17 mg/dL (ref 8–23)
CHLORIDE: 104 mmol/L (ref 98–111)
CO2: 28 mmol/L (ref 22–32)
CREATININE: 0.59 mg/dL (ref 0.44–1.00)
Calcium: 8.8 mg/dL — ABNORMAL LOW (ref 8.9–10.3)
GFR calc Af Amer: >60 mL/min (ref >60)
GFR calc non Af Amer: >60 mL/min (ref >60)
Glucose, Bld: 131 mg/dL — ABNORMAL HIGH (ref 70–99)
POTASSIUM: 3.8 mmol/L (ref 3.5–5.1)
SODIUM: 140 mmol/L (ref 135–145)

## 2017-11-10 LAB — CBC
HEMATOCRIT: 36.5 % (ref 35.0–47.0)
Hemoglobin: 12.3 g/dL (ref 12.0–16.0)
MCH: 28.9 pg (ref 26.0–34.0)
MCHC: 33.8 g/dL (ref 32.0–36.0)
MCV: 85.6 fL (ref 80.0–100.0)
Platelets: 300 10*3/uL (ref 150–440)
RBC: 4.26 MIL/uL (ref 3.80–5.20)
RDW: 14.3 % (ref 11.5–14.5)
WBC: 8.9 10*3/uL (ref 3.6–11.0)

## 2017-11-10 LAB — MRSA PCR SCREENING: MRSA by PCR: NEGATIVE

## 2017-11-10 MED ORDER — CEPHALEXIN 500 MG PO CAPS
500.0000 mg | ORAL_CAPSULE | Freq: Two times a day (BID) | ORAL | Status: DC
  Administered 2017-11-10 – 2017-11-11 (×3): 500 mg via ORAL
  Filled 2017-11-10 (×3): qty 1
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
## 2017-11-10 NOTE — Clinical Social Work Note (Signed)
CSW attempted to reach patient's daughter, Onnie BoerJennifer Hansen: [phone], but had to leave a message. Patient is from [facility] and CSW contacted Gala RomneyDoug at [facility] and they will take patient back at discharge and are aware discharge could be tomorrow. [name] MSW,LCSW [phone]
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
## 2017-11-11 ENCOUNTER — Observation Stay: Payer: Medicare Other

## 2017-11-11 DIAGNOSIS — S2242XA Multiple fractures of ribs, left side, initial encounter for closed fracture: Principal | ICD-10-CM

## 2017-11-11 DIAGNOSIS — W19XXXA Unspecified fall, initial encounter: Secondary | ICD-10-CM

## 2017-11-11 DIAGNOSIS — J939 Pneumothorax, unspecified: Secondary | ICD-10-CM | POA: Diagnosis not present

## 2017-11-11 LAB — CBC
HEMATOCRIT: 40.7 % (ref 35.0–47.0)
Hemoglobin: 13.4 g/dL (ref 12.0–16.0)
MCH: 28.5 pg (ref 26.0–34.0)
MCHC: 33 g/dL (ref 32.0–36.0)
MCV: 86.3 fL (ref 80.0–100.0)
Platelets: 313 10*3/uL (ref 150–440)
RBC: 4.72 MIL/uL (ref 3.80–5.20)
RDW: 13.9 % (ref 11.5–14.5)
WBC: 11.6 10*3/uL — AB (ref 3.6–11.0)

## 2017-11-11 MED ORDER — CEPHALEXIN 500 MG PO CAPS: 500.0000 mg | ORAL_CAPSULE | Freq: Two times a day (BID) | ORAL | 0 refills | Status: AC
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
## 2017-11-11 NOTE — Progress Notes (Signed)
EMS called for transport.

## 2017-11-11 NOTE — Care Management Note (Signed)
Case Management Note  Patient Details  Name: Katie Hansen MRN: [id number] Date of Birth: 1927-06-25  Subjective/Objective:   Patient to be discharged per MD order. Orders in place for home health services. Patient un oriented at this time. Voicemail left for daughter to confirm discharge planning. Previous RNCM has began workup for Home health. [facility] is preferred provider for patients from [facility]. Will confirm that this plan is ok once daughter returns my phone call. Patient to discharge via EMS. CSW following case as discharge plan is to a facility.  Buddy DutyJosh Larico Dimock RN BSN RNCM [phone]                   Action/Plan:   Expected Discharge Date:  11/11/17               Expected Discharge Plan:  Home w Home Health Services  In-House Referral:     Discharge planning Services  CM Consult  Post Acute Care Choice:  Home Health Choice offered to:  Patient, Adult Children  DME Arranged:    DME Agency:     HH Arranged:  RN, PT HH Agency:  [facility] (formerly [facility])  Status of Service:  Completed, signed off  If discussed at [employer] Meetings, dates discussed:    Additional Comments:  Virgel ManifoldJosh A Skyleen Bentley, RN 11/11/2017, 10:23 AM
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
## 2017-11-11 NOTE — [place] FL2 (Signed)
[place] MEDICAID FL2 LEVEL OF CARE SCREENING TOOL     IDENTIFICATION  Patient Name: Katie Hansen Birthdate: Aug 13, 1927 Sex: female Admission Date (Current Location): 11/09/2017  [place]   Facility and Address:  [facility], [address], [place], [place] [address]      Provider Number: [id number]  Attending Physician Name and Address:  Enedina FinnerPatel, Sona, MD  Relative Name and Phone Number:       Current Level of Care: Hospital Recommended Level of Care: Assisted Living Facility, Memory Care Prior Approval Number:    Date Approved/Denied:   PASRR Number:    Discharge Plan: (ALF memory care)    Current Diagnoses:dementia Patient Active Problem List   Diagnosis Date Noted  . Fall   . Pneumothorax   . Multiple rib fractures 11/09/2017  . CVA (cerebral vascular accident) (HCC) 04/10/2016  . Toenail fungus 08/13/2014  . Hyperglycemia 02/08/2014  . Osteoporosis, senile   . ALLERGIC RHINITIS 02/25/2010  . Dementia without behavioral disturbance 03/28/2009  . Essential hypertension 03/28/2009    Orientation RESPIRATION BLADDER Height & Weight     Self  Normal Incontinent Weight: 125 lb 7.1 oz (56.9 kg) Height:     BEHAVIORAL SYMPTOMS/MOOD NEUROLOGICAL BOWEL NUTRITION STATUS  (none) (none) Incontinent Diet(heart healthy)  AMBULATORY STATUS COMMUNICATION OF NEEDS Skin   Extensive Assist Verbally Normal                       Personal Care Assistance Level of Assistance  Bathing, Feeding, Dressing Bathing Assistance: Maximum assistance Feeding assistance: Limited assistance Dressing Assistance: Maximum assistance     Functional Limitations Info  (none reported)          SPECIAL CARE FACTORS FREQUENCY  PT (By licensed PT)                    Contractures Contractures Info: Not present    Additional Factors Info  Code Status Code Status Info: dnr             Medication List     STOP taking these medications   divalproex 125 MG capsule Commonly known as:  DEPAKOTE SPRINKLE     TAKE these medications   acetaminophen 500 MG tablet Commonly known as:  TYLENOL Take 500 mg by mouth 2 (two) times daily. Take 500 mg by mouth twice daily at 8 am and 4 pm. May also take every 4 hours as needed (not to exceed 2000 mg within 24 hours).   aspirin 81 MG EC tablet Take 1 tablet (81 mg total) by mouth daily.   cephALEXin 500 MG capsule Commonly known as:  KEFLEX Take 1 capsule (500 mg total) by mouth every 12 (twelve) hours.   docusate sodium 100 MG capsule Commonly known as:  COLACE Take 100 mg by mouth daily.   eucerin cream Apply 1 application topically every morning.   furosemide 20 MG tablet Commonly known as:  LASIX Take 20 mg by mouth daily.   guaifenesin 100 MG/5ML syrup Commonly known as:  ROBITUSSIN Take 200 mg by mouth every 6 (six) hours as needed for cough.   HYDROcodone-acetaminophen 5-325 MG tablet Commonly known as:  NORCO/VICODIN Take 1 tablet by mouth every 4 (four) hours as needed for moderate pain or severe pain.   hydrocortisone 2.5 % rectal cream Commonly known as:  ANUSOL-HC Place 1 application rectally 2 (two) times daily as needed for hemorrhoids or anal itching.  loperamide 2 MG capsule Commonly known as:  IMODIUM Take 2 mg by mouth as needed for diarrhea or loose stools ((max 8 doses per 24 hours)).   LORazepam 0.5 MG tablet Commonly known as:  ATIVAN Take 0.5 mg by mouth 3 (three) times daily.   magnesium hydroxide 400 MG/5ML suspension Commonly known as:  MILK OF MAGNESIA Take 30 mLs by mouth at bedtime as needed for mild constipation.   Melatonin 5 MG Tabs Take 5 mg by mouth at bedtime.   MINTOX 200-200-20 MG/5ML suspension Generic drug:  alum & mag hydroxide-simeth Take 30 mLs by mouth every 6 (six) hours as needed for indigestion or heartburn.   neomycin-bacitracin-polymyxin Oint Commonly known  as:  NEOSPORIN Apply 1 application topically as directed.   NONFORMULARY OR COMPOUNDED ITEM [LORAZEPAM TOPICAL GEL] - Apply 1ml (1mg ) topically three times daily as needed for agitation   polyethylene glycol packet Commonly known as:  MIRALAX / GLYCOLAX Take 17 g by mouth daily as needed.   potassium chloride 10 MEQ tablet Commonly known as:  K-DUR,KLOR-CON Take 10 mEq by mouth daily.   senna-docusate 8.6-50 MG tablet Commonly known as:  Senokot-S Take 1 tablet by mouth 2 (two) times daily.   sertraline 50 MG tablet Commonly known as:  ZOLOFT Take 50 mg by mouth every evening.   traZODone 50 MG tablet Commonly known as:  DESYREL Take 50 mg by mouth every 6 (six) hours as needed for sleep.        Additional Information Home Health to follow  York Spaniel, LCSW

## 2017-11-11 NOTE — Clinical Social Work Note (Signed)
Clinical Social Work Assessment  Patient Details  Name: Katie Hansen MRN: [id number] Date of Birth: [birth year]  Date of referral:  11/11/17               Reason for consult:  Discharge Planning                Permission sought to share information with:    Permission granted to share information::     Name::        Agency::     Relationship::     Contact Information:     Housing/Transportation Living arrangements for the past 2 months:  Assisted Living Facility Source of Information:  Medical Team Patient Interpreter Needed:  None Criminal Activity/Legal Involvement Pertinent to Current Situation/Hospitalization:  No - Comment as needed Significant Relationships:    Lives with:    Do you feel safe going back to the place where you live?    Need for family participation in patient care:     Care giving concerns:  none   Social Worker assessment / plan:  Patient resides in long term memory care at [facility]. Patient's daughter never returned my call but Dr. Allena KatzPatel was able to reach her and notified her of discharge.  Employment status:    Health and safety inspectornsurance information:    PT Recommendations:  Home with Home Health Information / Referral to community resources:     Patient/Family's Response to care:  none  Patient/Family's Understanding of and Emotional Response to Diagnosis, Current Treatment, and Prognosis:  None; daughter not very close with her mom  Emotional Assessment Appearance:    Attitude/Demeanor/Rapport:    Affect (typically observed):    Orientation:  Oriented to Self Alcohol / Substance use:  Not Applicable Psych involvement (Current and /or in the community):  No (Comment)  Discharge Needs  Concerns to be addressed:  Care Coordination Readmission within the last 30 days:  No Current discharge risk:  None Barriers to Discharge:  No Barriers Identified   York SpanielMonica Leilynn Pilat, LCSW 11/11/2017, 11:45 AM

## 2017-11-11 NOTE — Care Management Obs Status (Signed)
MEDICARE OBSERVATION STATUS NOTIFICATION   Patient Details  Name: Katie Hansen MRN: [id number] Date of Birth: [birth year]   Medicare Observation Status Notification Given:  Yes Left 2 voicemail messages for daughter. Left notice in the room with CM contact information.   Eber HongGreene, Brennan Litzinger R, RN 11/11/2017, 7:55 AM

## 2017-11-11 NOTE — Clinical Social Work Note (Signed)
Physician discharging patient back to [facility] today. CSW has spoken to [place] [facility] [place] and discharge information has been sent. Physician contacted patient's daughter and she is aware of discharge. York SpanielMonica Tenicia Gural MsW,LCSW [phone]

## 2018-02-24 ENCOUNTER — Encounter: Payer: Medicare Other | Admitting: Student

## 2018-03-23 DEATH — deceased

## 2018-06-19 IMAGING — CT CT CERVICAL SPINE W/O CM
3 of 7 series · 13 of 33 positions shown, 15 images · non-contrast
Comparison: CT head and cervical spine dated May 23, 2017.

CLINICAL DATA: Fall.

EXAM:
CT HEAD WITHOUT CONTRAST
CT CERVICAL SPINE WITHOUT CONTRAST
TECHNIQUE: Multidetector CT imaging of the head and cervical spine was
performed following the standard protocol without intravenous
contrast. Multiplanar CT image reconstructions of the cervical spine
were also generated.

[Series 9: c spine soft · axial · 0.50mm/px · z∈[+214,+320]mm · 6 of 75 slices shown, 8 images]
[im 11/75  soft-tissue]
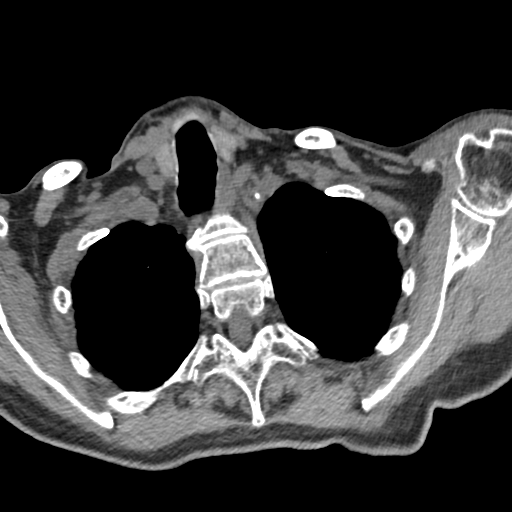
[im 11/75  bone]
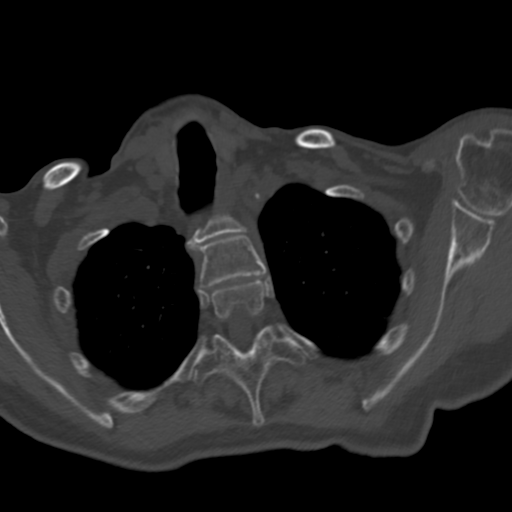
[im 22/75  bone]
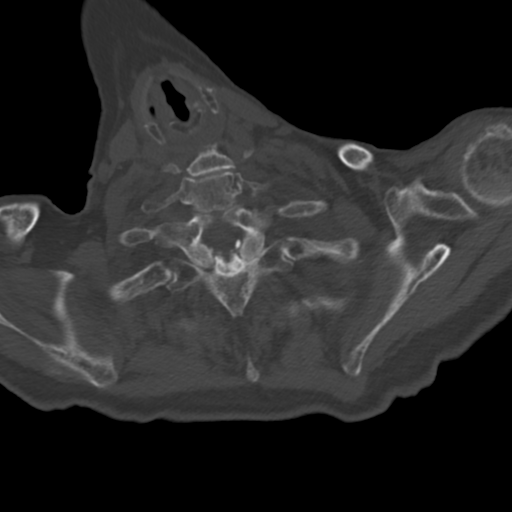
[im 32/75  bone]
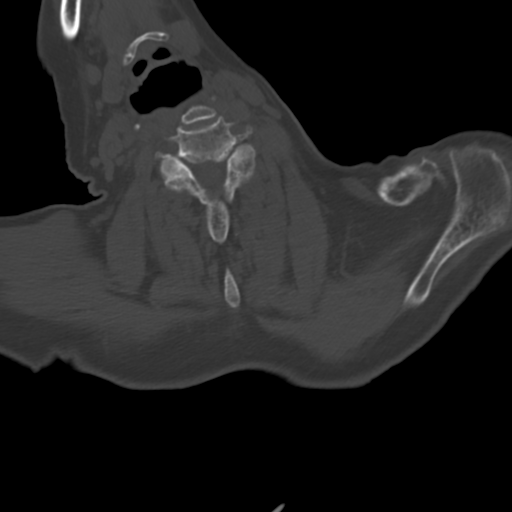
[im 43/75  bone]
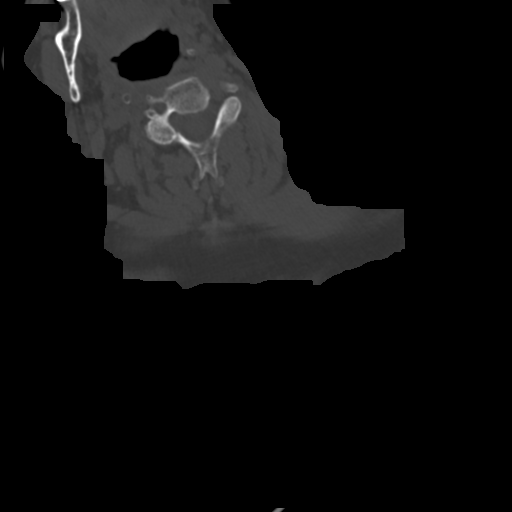
[im 53/75  soft-tissue]
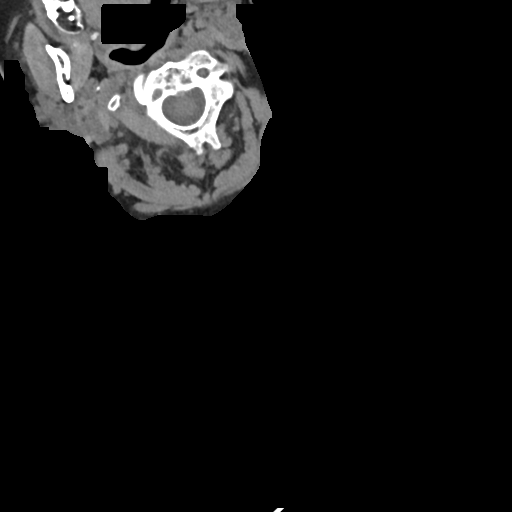
[im 53/75  bone]
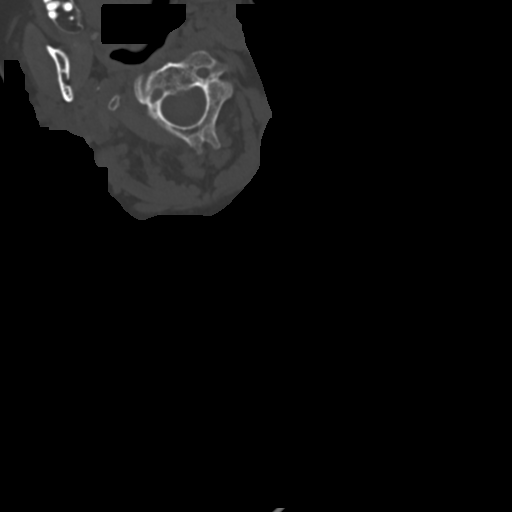
[im 64/75  bone]
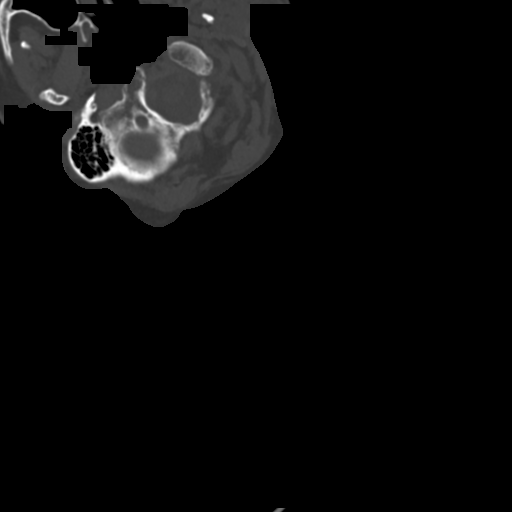

[Series 10: sagittal bone · sagittal · 0.27mm/px · 4 of 44 slices shown]
[im 9/44  bone]
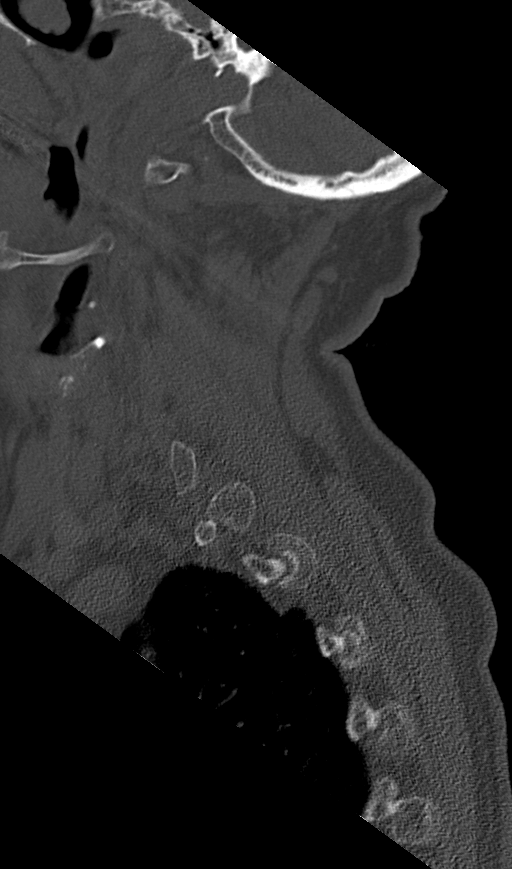
[im 18/44  bone]
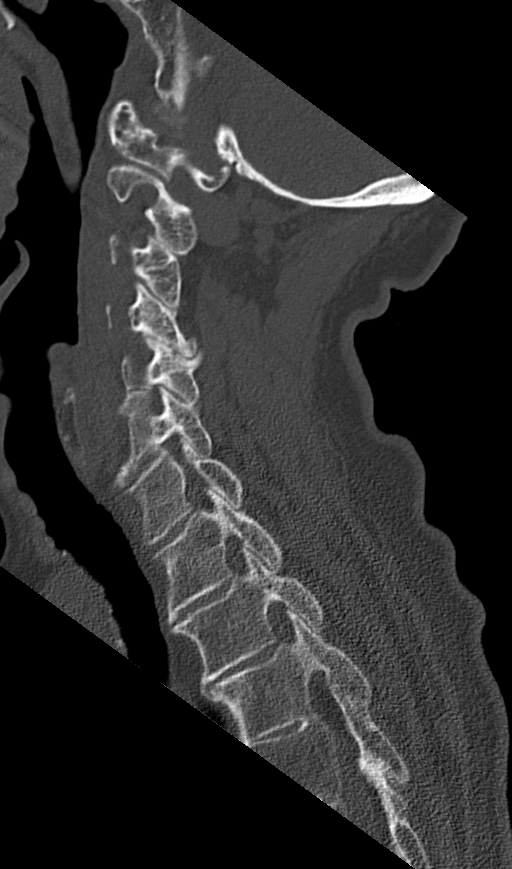
[im 26/44  bone]
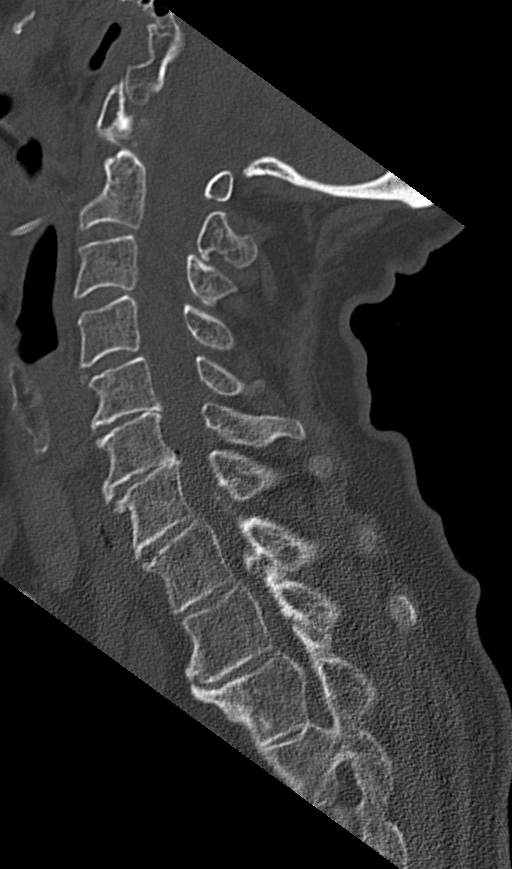
[im 35/44  bone]
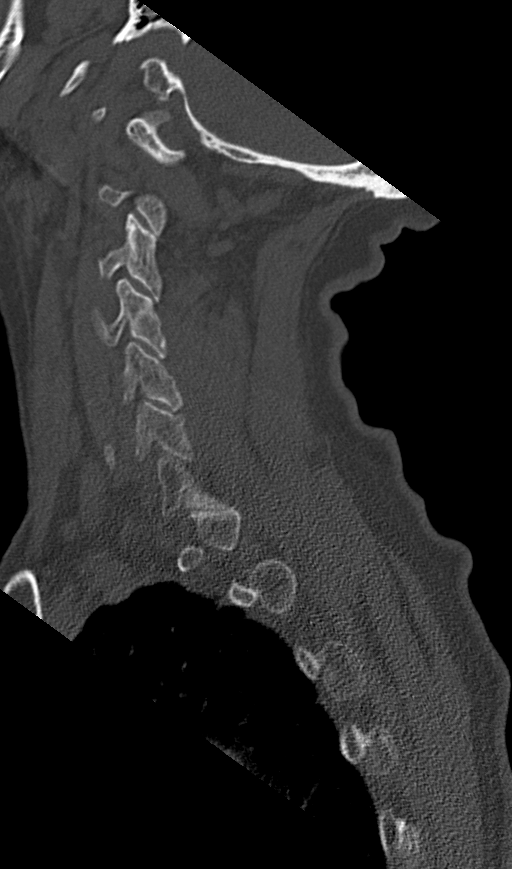

[Series 17: coronal soft tissue · coronal · 0.28mm/px · 3 of 58 slices shown]
[im 15/58  bone]
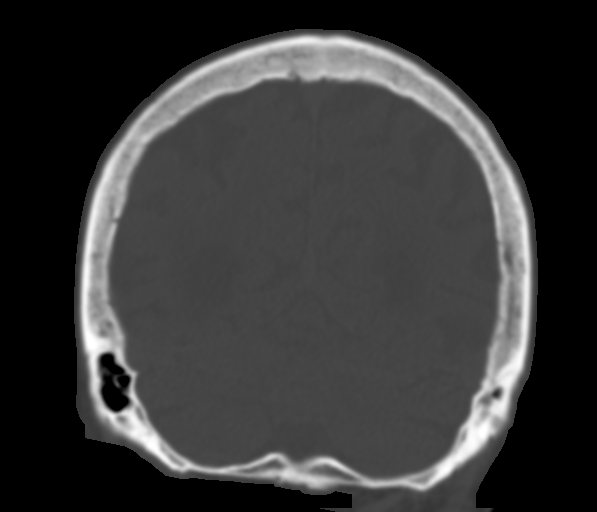
[im 29/58  bone]
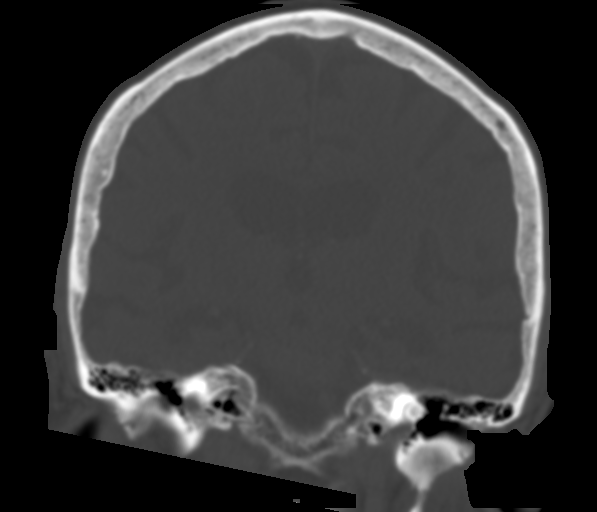
[im 43/58  bone]
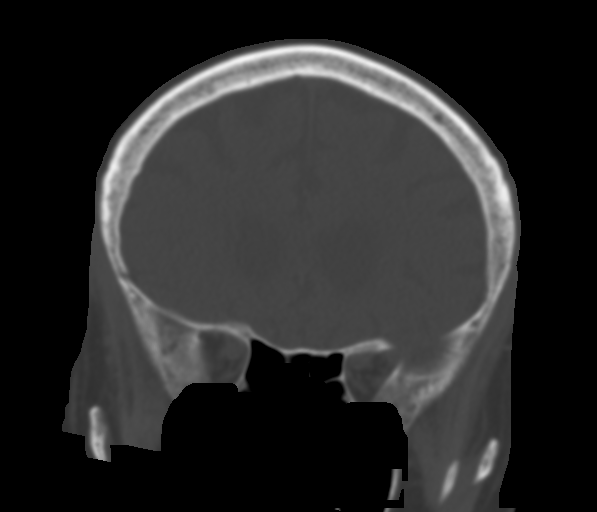

[13 of 33 positions shown; findings below may reference images not displayed]

FINDINGS: CT HEAD FINDINGS

Brain: No evidence of acute infarction, hemorrhage, hydrocephalus,
extra-axial collection or mass lesion/mass effect. Stable atrophy
and chronic microvascular ischemic changes.

Vascular: Atherosclerotic vascular calcification of the carotid
siphons. No hyperdense vessel.

Skull: Negative for fracture or focal lesion.

Sinuses/Orbits: No acute finding.

Other: Small right frontal scalp hematoma.

CT CERVICAL SPINE FINDINGS

Alignment: Normal cervical lordosis.  No traumatic malalignment.

Skull base and vertebrae: No acute fracture. No primary bone lesion
or focal pathologic process.

Soft tissues and spinal canal: No prevertebral fluid or swelling. No
visible canal hematoma.

Disc levels: Moderate disc height loss and uncovertebral hypertrophy
at C5-C6 and C6-C7, unchanged. Moderate facet arthropathy on the
right at C4-C5.

Upper chest: Negative.

Other: None.
IMPRESSION: 1. No acute intracranial abnormality. Small right frontal scalp
hematoma.
2.  No acute cervical spine fracture.
